# Patient Record
Sex: Male | Born: 1955 | Race: Black or African American | Hispanic: No | Marital: Married | Smoking: Current every day smoker
Health system: Southern US, Community
[De-identification: ages and names within clinical notes are randomized; demographics above are authoritative.]

## PROBLEM LIST (undated history)

## (undated) DIAGNOSIS — Z9289 Personal history of other medical treatment: Secondary | ICD-10-CM

## (undated) DIAGNOSIS — I10 Essential (primary) hypertension: Secondary | ICD-10-CM

## (undated) DIAGNOSIS — H269 Unspecified cataract: Secondary | ICD-10-CM

## (undated) DIAGNOSIS — R0683 Snoring: Secondary | ICD-10-CM

## (undated) DIAGNOSIS — E7800 Pure hypercholesterolemia, unspecified: Secondary | ICD-10-CM

## (undated) DIAGNOSIS — I517 Cardiomegaly: Secondary | ICD-10-CM

## (undated) DIAGNOSIS — R079 Chest pain, unspecified: Secondary | ICD-10-CM

## (undated) HISTORY — DX: Essential (primary) hypertension: I10

## (undated) HISTORY — DX: Snoring: R06.83

## (undated) HISTORY — PX: WISDOM TOOTH EXTRACTION: SHX21

## (undated) HISTORY — DX: Personal history of other medical treatment: Z92.89

## (undated) HISTORY — DX: Chest pain, unspecified: R07.9

## (undated) HISTORY — PX: CATARACT EXTRACTION, BILATERAL: SHX1313

## (undated) HISTORY — DX: Unspecified cataract: H26.9

---

## 2003-03-12 ENCOUNTER — Emergency Department (HOSPITAL_COMMUNITY): Admission: EM | Admit: 2003-03-12 | Discharge: 2003-03-13 | Payer: Self-pay | Admitting: Emergency Medicine

## 2014-06-20 ENCOUNTER — Encounter: Payer: Self-pay | Admitting: Cardiovascular Disease

## 2014-06-20 ENCOUNTER — Ambulatory Visit (INDEPENDENT_AMBULATORY_CARE_PROVIDER_SITE_OTHER): Payer: PRIVATE HEALTH INSURANCE | Admitting: Cardiovascular Disease

## 2014-06-20 VITALS — BP 152/86 | HR 56 | Ht 71.0 in | Wt 188.0 lb

## 2014-06-20 DIAGNOSIS — I10 Essential (primary) hypertension: Secondary | ICD-10-CM

## 2014-06-20 DIAGNOSIS — R9431 Abnormal electrocardiogram [ECG] [EKG]: Secondary | ICD-10-CM | POA: Insufficient documentation

## 2014-06-20 MED ORDER — LOSARTAN POTASSIUM-HCTZ 50-12.5 MG PO TABS: 1.0000 | ORAL_TABLET | Freq: Every day | ORAL | Status: DC

## 2014-06-20 NOTE — Assessment & Plan Note (Signed)
?   Hypertrophic vs LVH vs normal variant for him.  Echo to assess myocardium  Rx BP better  No murmur on exam

## 2014-06-20 NOTE — Patient Instructions (Addendum)
Your physician recommends that you schedule a follow-up appointment in: NEXT AVAILABLE  WITH [facility]  Your physician has recommended you make the following change in your medication:  START  LOSARTAN HCTZ  50/12.5 MG  1 Lamar has requested that you have an echocardiogram. Echocardiography is a painless test that uses sound waves to create images of your heart. It provides your doctor with information about the size and shape of your heart and how well your heart's chambers and valves are working. This procedure takes approximately one hour. There are no restrictions for this procedure.

## 2014-06-20 NOTE — Progress Notes (Signed)
Patient ID: Luke Wheeler, male   DOB: 1956/08/06, 58 y.o.   MRN: [id number]  58 yo referred by primary for HTN and abnormal ECG  Rx for BP with bisoprolol and losartan started in August.  Reviewed ECG and shows SR with LVH He travels a lot and has worked at [employer].  Did not think losartan helped with BP  Atenolol only taken regularly this past week.  No history of chronic kidney Disease.  No excess NSAI or ETOH.  Denies family history of HOCM or sudden death.  Has two children with 2nd wife who he med in [place] as she  Was the [facility] doctor caring for his BP.      ROS: Denies fever, malais, weight loss, blurry vision, decreased visual acuity, cough, sputum, SOB, hemoptysis, pleuritic pain, palpitaitons, heartburn, abdominal pain, melena, lower extremity edema, claudication, or rash.  All other systems reviewed and negative   General: Affect appropriate Healthy:  appears stated age 27: normal Neck supple with no adenopathy JVP normal no bruits no thyromegaly Lungs clear with no wheezing and good diaphragmatic motion Heart:  S1/S2 no murmur,rub, gallop or click PMI normal Abdomen: benighn, BS positve, no tenderness, no AAA no bruit.  No HSM or HJR Distal pulses intact with no bruits No edema Neuro non-focal Skin warm and dry No muscular weakness  Medications Current Outpatient Prescriptions  Medication Sig Dispense Refill  . aspirin EC 81 MG tablet Take 81 mg by mouth daily.      Marland Kitchen atenolol (TENORMIN) 100 MG tablet Take 1/2 tablet daily       No current facility-administered medications for this visit.    Allergies Review of patient's allergies indicates no known allergies.  Family History: Family History  Problem Relation Age of Onset  . Diabetes Mother   . Hyperlipidemia Father   . Stroke Father     Social History: History   Social History  . Marital Status: Divorced    Spouse Name: N/A    Number of Children: N/A  . Years of Education: N/A   Occupational  History  . Not on file.   Social History Main Topics  . Smoking status: Current Every Day Smoker  . Smokeless tobacco: Not on file  . Alcohol Use: Yes  . Drug Use: No  . Sexual Activity: Not on file   Other Topics Concern  . Not on file   Social History Narrative  . No narrative on file    History reviewed. No pertinent past surgical history.  Past Medical History  Diagnosis Date  . Hypertension     Electrocardiogram:  NSR LVH  8/15  Today SR rate 56 LVH with lateral T wave changes   Assessment and Plan

## 2014-06-20 NOTE — Assessment & Plan Note (Signed)
Will start hyzaar 50/12.5 and keep atenolol at current dose.  This is limited by relatively low HR  Will need BMET in 4 weeks and f/u with me as needed

## 2014-06-22 ENCOUNTER — Telehealth (HOSPITAL_COMMUNITY): Payer: Self-pay | Admitting: *Deleted

## 2014-06-22 ENCOUNTER — Ambulatory Visit: Payer: Self-pay | Admitting: Internal Medicine

## 2014-06-26 ENCOUNTER — Ambulatory Visit (HOSPITAL_COMMUNITY): Payer: PRIVATE HEALTH INSURANCE | Attending: Cardiology

## 2014-06-26 DIAGNOSIS — Z720 Tobacco use: Secondary | ICD-10-CM | POA: Insufficient documentation

## 2014-06-26 DIAGNOSIS — R9431 Abnormal electrocardiogram [ECG] [EKG]: Secondary | ICD-10-CM

## 2014-06-26 DIAGNOSIS — I10 Essential (primary) hypertension: Secondary | ICD-10-CM | POA: Diagnosis not present

## 2014-06-26 NOTE — Progress Notes (Signed)
2D Echo completed. 06/26/2014 

## 2014-09-04 ENCOUNTER — Ambulatory Visit: Payer: PRIVATE HEALTH INSURANCE | Admitting: Cardiovascular Disease

## 2014-11-11 NOTE — Progress Notes (Signed)
Patient ID: Luke Wheeler, male   DOB: 03/28/1956, 59 y.o.   MRN: [id number]  58 y.o.  referred by primary for HTN and abnormal ECG  Rx for BP with bisoprolol and losartan started in August.  Reviewed ECG and shows SR with LVH He travels a lot and has worked at [employer].  Did not think losartan helped with BP  Atenolol only taken regularly this past week.  No history of chronic kidney Disease.  No excess NSAI or ETOH.  Denies family history of HOCM or sudden death.  Has two children with 2nd wife who he med in [place] as she  Was the [facility] doctor caring for his BP.    Last visit diuretic added.  Echo reviewed  06/26/14  Impressions:  - Normal LV size and systolic function, EF 65%. There was moderate asymmetric septal hypertrophy best seen in the parasternal long axis. There was no mitral valve SAM or LV outflow tract gradient. Cannot rule out hypertrophic cardiomyopathy variant, but could also be asymmetric hypertrophy in the setting of long-standing hypertension. The RV appeared normal.   ROS: Denies fever, malais, weight loss, blurry vision, decreased visual acuity, cough, sputum, SOB, hemoptysis, pleuritic pain, palpitaitons, heartburn, abdominal pain, melena, lower extremity edema, claudication, or rash.  All other systems reviewed and negative   General: Affect appropriate Healthy:  appears stated age 22: normal Neck supple with no adenopathy JVP normal no bruits no thyromegaly Lungs clear with no wheezing and good diaphragmatic motion Heart:  S1/S2 no murmur,rub, gallop or click PMI normal Abdomen: benighn, BS positve, no tenderness, no AAA no bruit.  No HSM or HJR Distal pulses intact with no bruits No edema Neuro non-focal Skin warm and dry No muscular weakness  Medications Current Outpatient Prescriptions  Medication Sig Dispense Refill  . aspirin EC 81 MG tablet Take 81 mg by mouth daily.    Marland Kitchen atenolol (TENORMIN) 100 MG tablet Take 1/2 tablet daily-PT DOES  NOT TAKE EVERYDAY    . losartan-hydrochlorothiazide (HYZAAR) 50-12.5 MG per tablet Take 1 tablet by mouth daily. 30 tablet 11   No current facility-administered medications for this visit.    Allergies Review of patient's allergies indicates no known allergies.  Family History: Family History  Problem Relation Age of Onset  . Diabetes Mother   . Hyperlipidemia Father   . Stroke Father     Social History: History   Social History  . Marital Status: Divorced    Spouse Name: N/A  . Number of Children: N/A  . Years of Education: N/A   Occupational History  . Not on file.   Social History Main Topics  . Smoking status: Current Every Day Smoker  . Smokeless tobacco: Not on file  . Alcohol Use: Yes  . Drug Use: No  . Sexual Activity: Not on file   Other Topics Concern  . Not on file   Social History Narrative  . No narrative on file    No past surgical history on file.  Past Medical History  Diagnosis Date  . Hypertension     Electrocardiogram:  NSR LVH  8/15  Today SR rate 56 LVH with lateral T wave changes   Assessment and Plan

## 2014-11-12 ENCOUNTER — Encounter: Payer: PRIVATE HEALTH INSURANCE | Admitting: Cardiovascular Disease

## 2014-11-21 ENCOUNTER — Encounter: Payer: Self-pay | Admitting: Cardiovascular Disease

## 2014-11-30 ENCOUNTER — Emergency Department (HOSPITAL_COMMUNITY): Payer: Managed Care, Other (non HMO)

## 2014-11-30 ENCOUNTER — Encounter (HOSPITAL_COMMUNITY): Payer: Self-pay | Admitting: Family Medicine

## 2014-11-30 ENCOUNTER — Emergency Department (HOSPITAL_COMMUNITY): Admission: EM | Admit: 2014-11-30 | Discharge: 2014-11-30 | Discharge status: Absent without leave | Payer: Self-pay

## 2014-11-30 ENCOUNTER — Observation Stay (HOSPITAL_COMMUNITY)
Admission: EM | Admit: 2014-11-30 | Discharge: 2014-12-01 | Disposition: A | Discharge status: Home / Self Care | Payer: Managed Care, Other (non HMO) | Attending: Cardiovascular Disease | Admitting: Cardiovascular Disease

## 2014-11-30 DIAGNOSIS — E780 Pure hypercholesterolemia: Secondary | ICD-10-CM | POA: Insufficient documentation

## 2014-11-30 DIAGNOSIS — Z79899 Other long term (current) drug therapy: Secondary | ICD-10-CM | POA: Diagnosis not present

## 2014-11-30 DIAGNOSIS — I200 Unstable angina: Secondary | ICD-10-CM | POA: Insufficient documentation

## 2014-11-30 DIAGNOSIS — I209 Angina pectoris, unspecified: Secondary | ICD-10-CM | POA: Diagnosis not present

## 2014-11-30 DIAGNOSIS — R079 Chest pain, unspecified: Principal | ICD-10-CM | POA: Insufficient documentation

## 2014-11-30 DIAGNOSIS — I517 Cardiomegaly: Secondary | ICD-10-CM

## 2014-11-30 DIAGNOSIS — I10 Essential (primary) hypertension: Secondary | ICD-10-CM

## 2014-11-30 DIAGNOSIS — Z720 Tobacco use: Secondary | ICD-10-CM | POA: Insufficient documentation

## 2014-11-30 DIAGNOSIS — R002 Palpitations: Secondary | ICD-10-CM | POA: Insufficient documentation

## 2014-11-30 DIAGNOSIS — R9431 Abnormal electrocardiogram [ECG] [EKG]: Secondary | ICD-10-CM | POA: Diagnosis present

## 2014-11-30 DIAGNOSIS — E785 Hyperlipidemia, unspecified: Secondary | ICD-10-CM

## 2014-11-30 HISTORY — DX: Personal history of other medical treatment: Z92.89

## 2014-11-30 HISTORY — DX: Cardiomegaly: I51.7

## 2014-11-30 HISTORY — DX: Pure hypercholesterolemia, unspecified: E78.00

## 2014-11-30 LAB — CBC
HCT: 42.4 % (ref 39.0–52.0)
HEMOGLOBIN: 13.8 g/dL (ref 13.0–17.0)
MCH: 29.2 pg (ref 26.0–34.0)
MCHC: 32.5 g/dL (ref 30.0–36.0)
MCV: 89.6 fL (ref 78.0–100.0)
PLATELETS: 253 10*3/uL (ref 150–400)
RBC: 4.73 MIL/uL (ref 4.22–5.81)
RDW: 12.5 % (ref 11.5–15.5)
WBC: 7.4 10*3/uL (ref 4.0–10.5)

## 2014-11-30 LAB — BASIC METABOLIC PANEL
ANION GAP: 8 (ref 5–15)
BUN: 13 mg/dL (ref 6–23)
CALCIUM: 10 mg/dL (ref 8.4–10.5)
CO2: 25 mmol/L (ref 19–32)
Chloride: 106 mmol/L (ref 96–112)
Creatinine, Ser: 1.26 mg/dL (ref 0.50–1.35)
GFR calc Af Amer: 70 mL/min — ABNORMAL LOW (ref >90)
GFR calc non Af Amer: 61 mL/min — ABNORMAL LOW (ref >90)
Glucose, Bld: 85 mg/dL (ref 70–99)
Potassium: 4.7 mmol/L (ref 3.5–5.1)
Sodium: 139 mmol/L (ref 135–145)

## 2014-11-30 LAB — TSH: TSH: 0.533 u[IU]/mL (ref 0.350–4.500)

## 2014-11-30 LAB — I-STAT TROPONIN, ED
TROPONIN I, POC: 0 ng/mL (ref 0.00–0.08)
Troponin i, poc: 0.01 ng/mL (ref 0.00–0.08)

## 2014-11-30 LAB — BRAIN NATRIURETIC PEPTIDE: B NATRIURETIC PEPTIDE 5: 33.6 pg/mL (ref 0.0–100.0)

## 2014-11-30 LAB — HEPARIN LEVEL (UNFRACTIONATED): HEPARIN UNFRACTIONATED: 0.33 [IU]/mL (ref 0.30–0.70)

## 2014-11-30 LAB — TROPONIN I: Troponin I: <0.03 ng/mL (ref <0.031)

## 2014-11-30 MED ORDER — ACETAMINOPHEN 325 MG PO TABS
650.0000 mg | ORAL_TABLET | ORAL | Status: DC | PRN
  Administered 2014-12-01 (×2): 650 mg via ORAL
  Filled 2014-11-30 (×2): qty 2

## 2014-11-30 MED ORDER — ASPIRIN EC 81 MG PO TBEC
81.0000 mg | DELAYED_RELEASE_TABLET | Freq: Every day | ORAL | Status: DC
  Administered 2014-12-01: 81 mg via ORAL
  Filled 2014-11-30: qty 1

## 2014-11-30 MED ORDER — NITROGLYCERIN IN D5W 200-5 MCG/ML-% IV SOLN
2.0000 ug/min | INTRAVENOUS | Status: DC
  Administered 2014-11-30: 5 ug/min via INTRAVENOUS
  Filled 2014-11-30: qty 250

## 2014-11-30 MED ORDER — ASPIRIN 325 MG PO TABS
325.0000 mg | ORAL_TABLET | Freq: Once | ORAL | Status: AC
  Administered 2014-11-30: 325 mg via ORAL
  Filled 2014-11-30: qty 1

## 2014-11-30 MED ORDER — HYDROCHLOROTHIAZIDE 12.5 MG PO CAPS
12.5000 mg | ORAL_CAPSULE | Freq: Every day | ORAL | Status: DC
  Administered 2014-12-01: 12.5 mg via ORAL
  Filled 2014-11-30 (×2): qty 1

## 2014-11-30 MED ORDER — HEPARIN (PORCINE) IN NACL 100-0.45 UNIT/ML-% IJ SOLN
1050.0000 [IU]/h | INTRAMUSCULAR | Status: DC
  Administered 2014-11-30 – 2014-12-01 (×2): 1050 [IU]/h via INTRAVENOUS
  Filled 2014-11-30 (×2): qty 250

## 2014-11-30 MED ORDER — LOSARTAN POTASSIUM-HCTZ 50-12.5 MG PO TABS: 1.0000 | ORAL_TABLET | Freq: Every day | ORAL | Status: DC

## 2014-11-30 MED ORDER — PRAVASTATIN SODIUM 20 MG PO TABS
10.0000 mg | ORAL_TABLET | Freq: Every day | ORAL | Status: DC
  Administered 2014-11-30: 10 mg via ORAL
  Filled 2014-11-30 (×2): qty 1

## 2014-11-30 MED ORDER — LOSARTAN POTASSIUM 50 MG PO TABS
50.0000 mg | ORAL_TABLET | Freq: Every day | ORAL | Status: DC
  Administered 2014-11-30 – 2014-12-01 (×2): 50 mg via ORAL
  Filled 2014-11-30 (×2): qty 1

## 2014-11-30 MED ORDER — ONDANSETRON HCL 4 MG/2ML IJ SOLN: 4.0000 mg | Freq: Four times a day (QID) | INTRAMUSCULAR | Status: DC | PRN

## 2014-11-30 MED ORDER — HEPARIN BOLUS VIA INFUSION
4000.0000 [IU] | Freq: Once | INTRAVENOUS | Status: AC
  Administered 2014-11-30: 4000 [IU] via INTRAVENOUS
  Filled 2014-11-30: qty 4000

## 2014-11-30 MED ORDER — ATENOLOL 25 MG PO TABS
50.0000 mg | ORAL_TABLET | Freq: Every day | ORAL | Status: DC
  Administered 2014-11-30 – 2014-12-01 (×2): 50 mg via ORAL
  Filled 2014-11-30: qty 1
  Filled 2014-11-30: qty 2

## 2014-11-30 NOTE — ED Provider Notes (Signed)
CSN: [id number]     Arrival date & time 11/30/14  1226 History   First MD Initiated Contact with Patient 11/30/14 1318     Chief Complaint  Patient presents with  . Chest Pain     (Consider location/radiation/quality/duration/timing/severity/associated sxs/prior Treatment) HPI  Pt is a 59yo male with hx of HTN, high cholesterol, current daily smoker, sent to ED by [facility] with reports of an abnormal EKG.  Pt c/o intermittent left sided chest pain, pressure-like in nature with intermittent stabbing sensation, 6/10 at worst. Nothing makes pain better or worse.  Symptoms have been intermittent for 2-3 days but worse since yesterday.  Pt also reports some palpitations and mild SOB. Denies diaphoresis. Denies fever, chills, n/v/d. No cough or congestion. No hx of chest trauma. No hx of PE or DVT. Pt has been seen by cardiology, [name] in the past for abnormal EKG but denies hx of MI.  2D Echo from 06/26/14:  Impressions: - Normal LV size and systolic function, EF 02%. There was moderate asymmetric septal hypertrophy best seen in the parasternal long axis. There was no mitral valve SAM or LV outflow tract gradient. Cannot rule out hypertrophic cardiomyopathy variant, but could also be asymmetric hypertrophy in the setting of long-standing hypertension. The RV appeared normal.  Past Medical History  Diagnosis Date  . Hypertension   . High cholesterol    History reviewed. No pertinent past surgical history. Family History  Problem Relation Age of Onset  . Diabetes Mother   . Hyperlipidemia Father   . Stroke Father    History  Substance Use Topics  . Smoking status: Current Every Day Smoker  . Smokeless tobacco: Not on file  . Alcohol Use: Yes    Review of Systems  Constitutional: Negative for fever, chills, diaphoresis, appetite change and fatigue.  Respiratory: Negative for cough and shortness of breath.   Cardiovascular: Positive for chest pain and palpitations.  Negative for leg swelling.  Gastrointestinal: Negative for nausea, vomiting, abdominal pain and diarrhea.  Musculoskeletal: Negative for myalgias and back pain.  All other systems reviewed and are negative.     Allergies  Review of patient's allergies indicates no known allergies.  Home Medications   Prior to Admission medications   Medication Sig Start Date End Date Taking? Authorizing Provider  atenolol (TENORMIN) 100 MG tablet Take 50 mg by mouth daily. PT DOES NOT TAKE EVERYDAY 06/12/14  Yes Historical Provider, MD  fluvastatin (LESCOL) 20 MG capsule Take 20 mg by mouth at bedtime.   Yes Historical Provider, MD  losartan-hydrochlorothiazide (HYZAAR) 50-12.5 MG per tablet Take 1 tablet by mouth daily. 06/20/14  Yes [name], MD  VIAGRA 50 MG tablet Take 50 mg by mouth as needed. 11/26/14  Yes Historical Provider, MD   BP 167/84 mmHg  Pulse 65  Temp(Src) 98.1 F (36.7 C) (Oral)  Resp 22  SpO2 100% Physical Exam  Constitutional: He appears well-developed and well-nourished. No distress.  HENT:  Head: Normocephalic and atraumatic.  Eyes: Conjunctivae are normal. No scleral icterus.  Neck: Normal range of motion.  Cardiovascular: Normal rate, regular rhythm and normal heart sounds.   Pulmonary/Chest: Effort normal and breath sounds normal. No respiratory distress. He has no wheezes. He has no rales. He exhibits no tenderness.  Abdominal: Soft. Bowel sounds are normal. He exhibits no distension and no mass. There is no tenderness. There is no rebound and no guarding.  Musculoskeletal: Normal range of motion.  Neurological: He is alert.  Skin:  Skin is warm and dry. He is not diaphoretic.  Nursing note and vitals reviewed.   ED Course  Procedures (including critical care time) Labs Review Labs Reviewed  BASIC METABOLIC PANEL - Abnormal; Notable for the following:    GFR calc non Af Amer 61 (*)    GFR calc Af Amer 70 (*)    All other components within normal limits   CBC  BRAIN NATRIURETIC PEPTIDE  I-STAT TROPOININ, ED  Randolm Idol, ED    Imaging Review Dg Chest 2 View  11/30/2014   CLINICAL DATA:  Left-sided chest pain  EXAM: CHEST  2 VIEW  COMPARISON:  None.  FINDINGS: Cardiac shadow is at the upper limits of normal in size. The lungs are clear bilaterally. No focal infiltrate or sizable effusion is noted. No bony abnormality is seen.  IMPRESSION: No active cardiopulmonary disease.   Electronically Signed   By: Inez Catalina M.D.   On: 11/30/2014 13:47     EKG Interpretation   Date/Time:  Friday November 30 2014 12:33:39 EDT Ventricular Rate:  65 PR Interval:  176 QRS Duration: 82 QT Interval:  374 QTC Calculation: 388 R Axis:   58 Text Interpretation:  Normal sinus rhythm T wave abnormality, consider  lateral ischemia Abnormal ECG Confirmed by Jeneen Rinks  MD, [place] ([id number]) on  11/30/2014 3:23:08 PM      MDM   Final diagnoses:  Chest pain    Pt presenting to ED with c/o intermittent left sided chest pain, mild SOB, and palpitations for 2-3 days.  Sent to ED for further evaluation of abnormal EKG. EKG interpreted by Dr. Tanna Furry, Normal sinus rhythm T wave abnormality, consider lateral ischemia.    Initial troponin: negative for elevation. Discussed pt with Dr. Jeneen Rinks, pt is moderate risk for Major cardiac event based off HEART score, will consult cardiology.  Cardiology to come evaluate pt to help determine pt's disposition.  4:12 PM Consulted with cardiology, pt will be admitted to cardiology service for stress-test in the morning. Pt is stable at this time.  Filed Vitals:   11/30/14 1424  BP: 167/84  Pulse: 65 1400  Temp: 98.52F 1308  Resp: 22  O2 Sat 100% on RA @ [address] [name] 11/30/14 [place], MD 12/01/14 [id number]

## 2014-11-30 NOTE — H&P (Signed)
Cardiologist:  Luke Wheeler is an 59 y.o. male.   Chief Complaint: Chest Pain HPI:   The patient is a 59 yo male with a history of HLD, HTN, tobacco abuse.  EKG shows inferior and lateral TWI which were there in October 2015.  He had an echo in November 2015 revealing an EF of 55%, moderate asymmetric hypertrophy.  He presents with chest pain which started originally 3-4 weeks ago.  He noticed it while playing tennis one day, however, the next time he played he did not feel it.  Yesterday he woke up with chest pain which he describes as "compression". Today it was worse, 7/10.   He's had some dizziness and numbness in his fingers but it does not coincide with the CP.   He travels a lot for the [employer]. Recently came back from [place] and next week will be heading to [place] for about two months.  The patient currently denies nausea, vomiting, fever, shortness of breath, orthopnea, PND, cough, congestion, abdominal pain, hematochezia, melena, lower extremity edema, claudication.  Current pain level 5/10.    Past Medical History  Diagnosis Date  . Hypertension   . High cholesterol     History reviewed. No pertinent past surgical history.  Family History  Problem Relation Age of Onset  . Diabetes Mother   . Hyperlipidemia Father   . Stroke Father   . Coronary artery disease Father    Social History:  reports that he has been smoking.  He does not have any smokeless tobacco history on file. He reports that he drinks alcohol. He reports that he does not use illicit drugs.  Allergies: No Known Allergies   (Not in a hospital admission)  Results for orders placed or performed during the hospital encounter of 11/30/14 (from the past 48 hour(s))  CBC     Status: None   Collection Time: 11/30/14 12:45 PM  Result Value Ref Range   WBC 7.4 4.0 - 10.5 K/uL   RBC 4.73 4.22 - 5.81 MIL/uL   Hemoglobin 13.8 13.0 - 17.0 g/dL   HCT 42.4 39.0 - 52.0 %   MCV 89.6 78.0 - 100.0 fL   MCH 29.2 26.0  - 34.0 pg   MCHC 32.5 30.0 - 36.0 g/dL   RDW 12.5 11.5 - 15.5 %   Platelets 253 150 - 400 K/uL  Basic metabolic panel     Status: Abnormal   Collection Time: 11/30/14 12:45 PM  Result Value Ref Range   Sodium 139 135 - 145 mmol/L   Potassium 4.7 3.5 - 5.1 mmol/L   Chloride 106 96 - 112 mmol/L   CO2 25 19 - 32 mmol/L   Glucose, Bld 85 70 - 99 mg/dL   BUN 13 6 - 23 mg/dL   Creatinine, Ser 1.26 0.50 - 1.35 mg/dL   Calcium 10.0 8.4 - 10.5 mg/dL   GFR calc non Af Amer 61 (L) >90 mL/min   GFR calc Af Amer 70 (L) >90 mL/min    Comment: (NOTE) The eGFR has been calculated using the CKD EPI equation. This calculation has not been validated in all clinical situations. eGFR's persistently <90 mL/min signify possible Chronic Kidney Disease.    Anion gap 8 5 - 15  BNP (order ONLY if patient complains of dyspnea/SOB AND you have documented it for THIS visit)     Status: None   Collection Time: 11/30/14 12:45 PM  Result Value Ref Range   B Natriuretic Peptide 33.6  0.0 - 100.0 pg/mL  I-stat troponin, ED (not at [facility])     Status: None   Collection Time: 11/30/14  1:02 PM  Result Value Ref Range   Troponin i, poc 0.00 0.00 - 0.08 ng/mL   Comment 3            Comment: Due to the release kinetics of cTnI, a negative result within the first hours of the onset of symptoms does not rule out myocardial infarction with certainty. If myocardial infarction is still suspected, repeat the test at appropriate intervals.    Dg Chest 2 View  11/30/2014   CLINICAL DATA:  Left-sided chest pain  EXAM: CHEST  2 VIEW  COMPARISON:  None.  FINDINGS: Cardiac shadow is at the upper limits of normal in size. The lungs are clear bilaterally. No focal infiltrate or sizable effusion is noted. No bony abnormality is seen.  IMPRESSION: No active cardiopulmonary disease.   Electronically Signed   By: Inez Catalina M.D.   On: 11/30/2014 13:47    Review of Systems  Constitutional: Negative for fever and diaphoresis.   HENT: Positive for congestion (Nasal). Negative for sore throat.   Respiratory: Negative for cough and shortness of breath.   Cardiovascular: Positive for chest pain. Negative for orthopnea, leg swelling and PND.  Gastrointestinal: Negative for nausea, vomiting, abdominal pain, blood in stool and melena.  Genitourinary: Negative for hematuria.  Musculoskeletal: Negative for myalgias.  Neurological: Positive for dizziness and headaches (left side).  All other systems reviewed and are negative.   Blood pressure 153/89, pulse 64, temperature 98.1 F (36.7 C), temperature source Oral, resp. rate 22, SpO2 99 %. Physical Exam  Nursing note and vitals reviewed. Constitutional: He is oriented to person, place, and time. He appears well-developed and well-nourished. No distress.  HENT:  Head: Normocephalic and atraumatic.  Mouth/Throat: No oropharyngeal exudate.  Eyes: EOM are normal. Pupils are equal, round, and reactive to light. No scleral icterus.  Neck: Normal range of motion. Neck supple. No JVD present.  Cardiovascular: Normal rate, regular rhythm, S1 normal and S2 normal.   No murmur heard. Pulses:      Radial pulses are 2+ on the right side, and 2+ on the left side.       Dorsalis pedis pulses are 2+ on the right side, and 2+ on the left side.  No carotid bruits  Respiratory: Effort normal and breath sounds normal. He has no wheezes. He has no rales.  GI: Soft. Bowel sounds are normal. He exhibits no distension. There is no tenderness.  Musculoskeletal: He exhibits no edema.  Lymphadenopathy:    He has no cervical adenopathy.  Neurological: He is alert and oriented to person, place, and time. He exhibits normal muscle tone.  Skin: Skin is warm and dry.  Psychiatric: He has a normal mood and affect.     Assessment/Plan Principal Problem:   Chest pain Active Problems:   HTN (hypertension)   Tobacco abuse  Plan:  Initial troponin negative.  Inferior and lateral TWIs were  seen on prior EKG in October.  Echo, November 2015: 55%, moderate asymmetric hypertrophy.  He currently is having CP, 5/10.   Will add Heparin and IV NTG.  If pain free and troponin negative in the morning, Treadmill cardiolite tomorrow.  Check lipids, A1C, TSH.   Add beta blocker.  He does not look like he has a PE despite traveling a lot.  Tarri Fuller, [facility] 11/30/2014, 4:19 PM   The patient was  seen, examined and discussed with Tarri Fuller, [name] and I agree with the above.   59 year old male who presented with chest pain, that is typical exertional radiating to his arm. His risk factors include HTN, hyperlipidemia and ongoing smoking. He has abnormal ECG with negative T waves in the inferolateral leads. This is unchanged from prior ECG on 06/20/2014. Echocardiogram showed LVEF 55% with no regional wall motion abnormalities in November 2015. The first 2 troponins are negative.  We will start heparin and NTG drip.  Since its Friday, we will perform exercise nuclear stress test tomorrow, if any abnormality, we will plan for a left cardiac cath on Monday.  Add aspirin to his med list, continue atenolol, losartan, fluvastatin.   Dorothy Spark 11/30/2014

## 2014-11-30 NOTE — Progress Notes (Signed)
ANTICOAGULATION CONSULT NOTE - Initial Consult  Pharmacy Consult for Heparin Indication: chest pain/ACS  No Known Allergies  Patient Measurements:   Heparin Dosing Weight: 86 kg  Vital Signs: Temp: 98.1 F (36.7 C) (04/08 1308) Temp Source: Oral (04/08 1308) BP: 152/92 mmHg (04/08 1617) Pulse Rate: 64 (04/08 1600)  Labs:  Recent Labs  11/30/14 1245  HGB 13.8  HCT 42.4  PLT 253  CREATININE 1.26    CrCl cannot be calculated (Unknown ideal weight.).   Medical History: Past Medical History  Diagnosis Date  . Hypertension   . High cholesterol     Assessment: 6 YOM presented with chest pain, pharmacy is consulted to start IV heparin. He is not on anticoagulation PTA. Hgb 13.8, plt 253k.   Goal of Therapy:  Heparin level 0.3-0.7 units/ml Monitor platelets by anticoagulation protocol: Yes   Plan:  - Heparin bolus 4000 units x 1 then Heparin infusion 1050 units/hr - f/u 6 hr heparin level at 2300 - Daily heparin level and CBC  Maryanna Shape, PharmD, BCPS  Clinical Pharmacist  Pager: [phone]   11/30/2014,4:36 PM

## 2014-11-30 NOTE — Progress Notes (Signed)
ANTICOAGULATION CONSULT NOTE - Follow Up Consult  Pharmacy Consult for heparin Indication: chest pain/ACS  Labs:  Recent Labs  11/30/14 1245 11/30/14 2041 11/30/14 2234  HGB 13.8  --   --   HCT 42.4  --   --   PLT 253  --   --   HEPARINUNFRC  --   --  0.33  CREATININE 1.26  --   --   TROPONINI  --  <0.03  --      Assessment/Plan:  59yo male therapeutic on heparin with initial dosing for CP. Will continue gtt at current rate and confirm stable with am labs.   Wynona Neat, PharmD, BCPS  11/30/2014,11:57 PM

## 2014-11-30 NOTE — ED Notes (Signed)
Pt sent here from [facility] with abnormal EKG. sts chest pain over the past few days and worsening since yesterday. sts some heart palpitations and SOB

## 2014-12-01 ENCOUNTER — Other Ambulatory Visit: Payer: Self-pay | Admitting: Physician Assistant

## 2014-12-01 ENCOUNTER — Encounter (HOSPITAL_COMMUNITY): Payer: Self-pay | Admitting: Physician Assistant

## 2014-12-01 DIAGNOSIS — R0789 Other chest pain: Secondary | ICD-10-CM | POA: Diagnosis not present

## 2014-12-01 DIAGNOSIS — I517 Cardiomegaly: Secondary | ICD-10-CM

## 2014-12-01 LAB — CBC
HCT: 39.2 % (ref 39.0–52.0)
Hemoglobin: 12.4 g/dL — ABNORMAL LOW (ref 13.0–17.0)
MCH: 28.8 pg (ref 26.0–34.0)
MCHC: 31.6 g/dL (ref 30.0–36.0)
MCV: 91.2 fL (ref 78.0–100.0)
Platelets: 230 10*3/uL (ref 150–400)
RBC: 4.3 MIL/uL (ref 4.22–5.81)
RDW: 12.7 % (ref 11.5–15.5)
WBC: 8.1 10*3/uL (ref 4.0–10.5)

## 2014-12-01 LAB — HEPARIN LEVEL (UNFRACTIONATED): Heparin Unfractionated: 0.35 IU/mL (ref 0.30–0.70)

## 2014-12-01 LAB — TROPONIN I

## 2014-12-01 MED ORDER — ASPIRIN 81 MG PO TBEC: 81.0000 mg | DELAYED_RELEASE_TABLET | Freq: Every day | ORAL | Status: AC

## 2014-12-01 NOTE — Discharge Instructions (Signed)
Do not take Atenolol Monday morning until you hear about your stress test.  We want you to hold this medication the day of your stress test.  If they schedule it for Tuesday, you will take it and hold it Tuesday morning.  DO NOT EAT after midnight tomorrow (Sunday) night.  No coffee Monday morning.  You can take your medications with water.  Our office will call to arrange the stress test and give your further instructions.  Call [phone] for questions.

## 2014-12-01 NOTE — Progress Notes (Signed)
ANTICOAGULATION CONSULT NOTE - Follow Up Consult  Pharmacy Consult for heparin Indication: chest pain/ACS  No Known Allergies  Patient Measurements: Height: 5\' 11"  (180.3 cm) Weight: 191 lb 5.3 oz (86.788 kg) IBW/kg (Calculated) : 75.3  Vital Signs: Temp: 98.2 F (36.8 C) (04/09 0617) Temp Source: Oral (04/09 0617) BP: 128/76 mmHg (04/09 0617) Pulse Rate: 87 (04/09 0617)  Labs:  Recent Labs  11/30/14 1245 11/30/14 2041 11/30/14 2234 11/30/14 2330 12/01/14 0534  HGB 13.8  --   --   --  Luke.4*  HCT 42.4  --   --   --  39.2  PLT 253  --   --   --  230  HEPARINUNFRC  --   --  0.33  --  0.35  CREATININE 1.26  --   --   --   --   TROPONINI  --  <0.03  --  <0.03 <0.03    Estimated Creatinine Clearance: 67.2 mL/min (by C-G formula based on Cr of 1.26).   Medications:  Scheduled:  . aspirin EC  81 mg Oral Daily  . atenolol  50 mg Oral Daily  . losartan  50 mg Oral Daily   And  . hydrochlorothiazide  Luke.5 mg Oral Daily  . pravastatin  10 mg Oral q1800    Assessment: 59 yo Wheeler here with CP on heparin for r/o ACS. Heparin level is 0.35 and noted at goal. Hg/hct= Luke.4/39.2 and plt= 230. Plans noted for exercise nuclear stress test today and possible cath Monday based on results.   Goal of Therapy:  Heparin level 0.3-0.7 units/ml Monitor platelets by anticoagulation protocol: Yes   Plan:  -No heparin changes needed -Daily heparin level and CBC  Hildred Laser, Pharm D 12/01/2014 10:36 AM

## 2014-12-01 NOTE — Discharge Summary (Addendum)
Discharge Summary   Patient ID: Luke Wheeler, MRN: [id number], DOB/AGE: July 26, 1956 59 y.o.  Admit date: 11/30/2014 Discharge date: 12/01/2014   Primary Care Physician:  No primary care provider on file.   Primary Cardiologist:  Dr. [name]   Reason for Admission:  Chest Pain   Primary Discharge Diagnoses:  Principal Problem:   Chest pain Active Problems:   HTN (hypertension)   Abnormal ECG   Tobacco abuse   LVH (left ventricular hypertrophy)     Wt Readings from Last 3 Encounters:  12/01/14 191 lb 5.3 oz (86.788 kg)  06/20/14 188 lb (85.276 kg)    Secondary Discharge Diagnoses:   Past Medical History  Diagnosis Date  . Hypertension   . High cholesterol   . Hx of echocardiogram     Echo 11/15:  mod asymmetric septal hypertrophy, no LVOT gradient, EF 55%, no RWMA, Gr 1 DD, septal 16 mm, post wall 11.2 mm, no SAM, trivial MR, normal RVF, mild RAE (HCM variant vs LVH)  . 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 male HL, HTN, tobacco abuse.  Echo in 06/2014 demonstrated mod asymmetric septal hypertrophy without LVOT gradient, mild diastolic dysfunction.  Hypertrophic CM variant could not be rule out vs LVH from HTN.  He presented to the hospital on the date of admission with complaints of chest pain. He started having CP 3-4 weeks prior to admission.  He had it occasionally with exertion, but not always with exertion.  He awoke with chest pain the day prior to admission.  He had recently traveled to [place] for [employer].  He was noted to inferior and lateral TWI on the ECG that was old.  He was placed on NTG gtt and IV Heparin.  CEs remained negative. Plan was to get inpatient ETT-Myoview.  The test was never ordered.  The patient was seen by Dr. [name] this AM.  He was pain free. ECG changes  were felt to likely represent non-obstructive HCM.  It was felt that he could be DC to home with early OP stress Myoview.  Note was made to consider changing medications in the future to add Verapamil and DC diuretic. For now, current medications were continued. He was felt to be stable for DC to home.     Discharge Vitals:   Blood pressure 124/80, pulse 87, temperature 98.2 F (36.8 C), temperature source Oral, resp. rate 20, height 5\' 11"  (1.803 m), weight 191 lb 5.3 oz (86.788 kg), SpO2 98 %.   Labs:   Recent Labs  11/30/14 1245 12/01/14 0534  WBC 7.4 8.1  HGB 13.8 12.4*  HCT 42.4 39.2  MCV 89.6 91.2  PLT 253 230     Recent Labs  11/30/14 1245  NA 139  K 4.7  CL 106  CO2 25  BUN 13  CREATININE 1.26  CALCIUM 10.0     Recent Labs  11/30/14 2041 11/30/14 2330 12/01/14 0534  TROPONINI <0.03 <0.03 <0.03    No results found for: CHOL, HDL, LDLCALC, TRIG  No results found for: DDIMER  Lab Results  Component Value Date   TSH 0.533 11/30/2014    No results for input(s): INR in the last 72 hours.   Diagnostic Procedures and Studies:  Dg Chest  2 View  11/30/2014    IMPRESSION: No active cardiopulmonary disease.   Electronically Signed   By: Inez Catalina M.D.   On: 11/30/2014 13:47    Disposition:   Pt is being discharged home today in good condition.  Follow-up Plans & Appointments      Follow-up Information    Follow up with [facility].   Why:  We will arrange a stress test Monday or Tuesday   Contact information:   [address] [place] [address]       Follow up with [name], MD In 1 week.   Specialty:  Cardiology   Why:  office will arrange follow up with [name] or PA/NP 1-2 weeks after discharge from hospital   Contact information:   [address] [id number] [phone]       Discharge Medications    Medication List    TAKE these medications         aspirin 81 MG EC tablet  Take 1 tablet (81 mg total) by mouth daily.     atenolol 100 MG tablet  Commonly known as:  TENORMIN  Take 50 mg by mouth daily. PT DOES NOT TAKE EVERYDAY     fluvastatin 20 MG capsule  Commonly known as:  LESCOL  Take 20 mg by mouth at bedtime.     losartan-hydrochlorothiazide 50-12.5 MG per tablet  Commonly known as:  HYZAAR  Take 1 tablet by mouth daily.     VIAGRA 50 MG tablet  Generic drug:  sildenafil  Take 50 mg by mouth as needed.         Outstanding Labs/Studies  1. ETT-Myoview  Duration of Discharge Encounter: Greater than 30 minutes including physician and PA time.  Signed, Richardson Dopp, [name]   12/01/2014 1:41 PM

## 2014-12-01 NOTE — Progress Notes (Signed)
UR completed 

## 2014-12-01 NOTE — Progress Notes (Signed)
Patient ID: Luke Wheeler, male   DOB: June 17, 1956, 59 y.o.   MRN: [id number]    Subjective:  Denies SSCP, palpitations or Dyspnea No stress test ordered by Dr Meda Coffee on admission   Objective:  Filed Vitals:   12/01/14 0317 12/01/14 0417 12/01/14 0617 12/01/14 1045  BP: 118/70 120/75 128/76 124/80  Pulse:   87   Temp:   98.2 F (36.8 C)   TempSrc:   Oral   Resp:   20   Height:      Weight:   191 lb 5.3 oz (86.788 kg)   SpO2:   98%     Intake/Output from previous day:  Intake/Output Summary (Last 24 hours) at 12/01/14 1156 Last data filed at 12/01/14 0806  Gross per 24 hour  Intake 1136.68 ml  Output    300 ml  Net 836.68 ml    Physical Exam: Affect appropriate Healthy:  appears stated age HEENT: normal Neck supple with no adenopathy JVP normal no bruits no thyromegaly Lungs clear with no wheezing and good diaphragmatic motion Heart:  S1/S2 no murmur, no rub, gallop or click PMI normal Abdomen: benighn, BS positve, no tenderness, no AAA no bruit.  No HSM or HJR Distal pulses intact with no bruits No edema Neuro non-focal Skin warm and dry No muscular weakness   Lab Results: Basic Metabolic Panel:  Recent Labs  11/30/14 1245  NA 139  K 4.7  CL 106  CO2 25  GLUCOSE 85  BUN 13  CREATININE 1.26  CALCIUM 10.0   Liver Function Tests: No results for input(s): AST, ALT, ALKPHOS, BILITOT, PROT, ALBUMIN in the last 72 hours. No results for input(s): LIPASE, AMYLASE in the last 72 hours. CBC:  Recent Labs  11/30/14 1245 12/01/14 0534  WBC 7.4 8.1  HGB 13.8 12.4*  HCT 42.4 39.2  MCV 89.6 91.2  PLT 253 230   Cardiac Enzymes:  Recent Labs  11/30/14 2041 11/30/14 2330 12/01/14 0534  TROPONINI <0.03 <0.03 <0.03     Recent Labs  11/30/14 2041  TSH 0.533    Imaging: Dg Chest 2 View  11/30/2014   CLINICAL DATA:  Left-sided chest pain  EXAM: CHEST  2 VIEW  COMPARISON:  None.  FINDINGS: Cardiac shadow is at the upper limits of normal in size.  The lungs are clear bilaterally. No focal infiltrate or sizable effusion is noted. No bony abnormality is seen.  IMPRESSION: No active cardiopulmonary disease.   Electronically Signed   By: Inez Catalina M.D.   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 aspirin EC  81 mg Oral Daily  . atenolol  50 mg Oral Daily  . losartan  50 mg Oral Daily   And  . hydrochlorothiazide  12.5 mg Oral Daily  . pravastatin  10 mg Oral q1800     . heparin 1,050 Units/hr (12/01/14 0955)  . 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

## 2014-12-03 ENCOUNTER — Telehealth: Payer: Self-pay | Admitting: Cardiovascular Disease

## 2014-12-03 LAB — HEMOGLOBIN A1C
Hgb A1c MFr Bld: 5.5 % (ref 4.8–5.6)
Mean Plasma Glucose: 111 mg/dL

## 2014-12-03 NOTE — Telephone Encounter (Signed)
New message         TOC appt is Thursday 4/21 @ 12:10pm with [name]

## 2014-12-04 ENCOUNTER — Telehealth (HOSPITAL_COMMUNITY): Payer: Self-pay

## 2014-12-04 ENCOUNTER — Ambulatory Visit (HOSPITAL_COMMUNITY)
Admission: RE | Admit: 2014-12-04 | Discharge: 2014-12-04 | Disposition: A | Discharge status: Home / Self Care | Payer: Managed Care, Other (non HMO) | Source: Ambulatory Visit | Attending: Cardiovascular Disease | Admitting: Cardiovascular Disease

## 2014-12-04 DIAGNOSIS — R9431 Abnormal electrocardiogram [ECG] [EKG]: Secondary | ICD-10-CM | POA: Insufficient documentation

## 2014-12-04 DIAGNOSIS — I10 Essential (primary) hypertension: Secondary | ICD-10-CM | POA: Diagnosis not present

## 2014-12-04 DIAGNOSIS — F17210 Nicotine dependence, cigarettes, uncomplicated: Secondary | ICD-10-CM | POA: Diagnosis not present

## 2014-12-04 DIAGNOSIS — R0789 Other chest pain: Secondary | ICD-10-CM | POA: Insufficient documentation

## 2014-12-04 DIAGNOSIS — R42 Dizziness and giddiness: Secondary | ICD-10-CM | POA: Insufficient documentation

## 2014-12-04 DIAGNOSIS — R5383 Other fatigue: Secondary | ICD-10-CM | POA: Diagnosis not present

## 2014-12-04 DIAGNOSIS — R002 Palpitations: Secondary | ICD-10-CM | POA: Insufficient documentation

## 2014-12-04 DIAGNOSIS — R0609 Other forms of dyspnea: Secondary | ICD-10-CM | POA: Diagnosis not present

## 2014-12-04 MED ORDER — TECHNETIUM TC 99M SESTAMIBI GENERIC - CARDIOLITE
30.8000 | Freq: Once | INTRAVENOUS | Status: AC | PRN
  Administered 2014-12-04: 30.8 via INTRAVENOUS

## 2014-12-04 MED ORDER — TECHNETIUM TC 99M SESTAMIBI GENERIC - CARDIOLITE
10.4000 | Freq: Once | INTRAVENOUS | Status: AC | PRN
  Administered 2014-12-04: 10 via INTRAVENOUS

## 2014-12-04 NOTE — Procedures (Addendum)
[facility] [address] [place] [address] [phone]  Cardiology Nuclear Med Study  Luke Wheeler is a 59 y.o. male     MRN : [id number]     DOB: Feb 19, 1956  Procedure Date: 12/04/2014  Nuclear Med Background Indication for Stress Test:  Evaluation for Ischemia, [facility] and Abnormal EKG History:  nonobstructive ECHO-2015;No prior cardiac or respiratory history reported;No prior NUC MPI fop comparison. Cardiac Risk Factors: Hypertension, Lipids and Smoker  Symptoms:  Chest Pain, Dizziness, DOE, Fatigue, Light-Headedness and Palpitations   Nuclear Pre-Procedure Caffeine/Decaff Intake:  12:30am NPO After: 8:30am   IV Site: R Forearm  IV 0.9% NS with Angio Cath:  22g  Chest Size (in):  44"  IV Started by: Rolene Course, RN  Height: 5\' 11"  (1.803 m)  Cup Size: n/a  BMI:  Body mass index is 26.65 kg/(m^2). Weight:  191 lb (86.637 kg)   Tech Comments:  n/a    Nuclear Med Study 1 or 2 day study: 1 day  Stress Test Type:  Stress  Order Authorizing Provider:  [name], MD   Resting Radionuclide: Technetium 36m Sestamibi  Resting Radionuclide Dose: 10.4 mCi   Stress Radionuclide:  Technetium 20m Sestamibi  Stress Radionuclide Dose: 30.8 mCi           Stress Protocol Rest HR: 53 Stress HR: 160  Rest BP: 166/99 Stress BP: 198/96  Exercise Time (min): 8 METS: 10.10   Predicted Max HR: 161 bpm % Max HR: 99.38 bpm Rate Pressure Product: 31680  Dose of Adenosine (mg):  n/a Dose of Lexiscan: n/a mg  Dose of Atropine (mg): n/a Dose of Dobutamine: n/a mcg/kg/min (at max HR)  Stress Test Technologist: Mellody Memos, CCT Nuclear Technologist:Elizabeth Young,CNMT   Rest Procedure:  Myocardial perfusion imaging was performed at rest 45 minutes following the intravenous administration of Technetium 29m Sestamibi. Stress Procedure:  The patient performed treadmill exercise using a Bruce  Protocol for 8 minutes. The  patient stopped due to shortness of breath. Patient denied any chest pain.  There were significant ST-T wave changes.  Technetium 23m Sestamibi was injected IV at peak exercise and myocardial perfusion imaging was performed after a brief delay.  Transient Ischemic Dilatation (Normal <1.22):  1.11 QGS EDV:  115 ml QGS ESV:  58 ml LV Ejection Fraction: 50%     Rest ECG: NSR with T wave inversion 2 ,3 aVF, V5-6.  Stress ECG: No significant ST segment change suggestive of ischemia. Normalization of baseline T wave abnormality with exercise with return to baseline during recovery  QPS Raw Data Images:  Normal; no motion artifact; normal heart/lung ratio. Stress Images:  Normal homogeneous uptake in all areas of the myocardium. Rest Images:  Normal homogeneous uptake in all areas of the myocardium. Subtraction (SDS):  Normal  Impression Exercise Capacity:  Good exercise capacity. BP Response:  Normal blood pressure response. Clinical Symptoms:  No significant symptoms noted. ECG Impression:  No significant ST segment change suggestive of ischemia. Comparison with Prior Nuclear Study: No previous nuclear study performed  Overall Impression:  Normal stress nuclear study.  LV Wall Motion: Low normal LV Function, EF 50%; NL Wall Motion   Genasis Zingale A, MD  12/04/2014 4:53 PM

## 2014-12-04 NOTE — Telephone Encounter (Signed)
Encounter complete. 

## 2014-12-04 NOTE — Telephone Encounter (Signed)
Patient contacted regarding discharge from [facility] on 12/01/14.  Patient understands to follow up with provider Richardson Dopp on April 21,2016  at 12:10PM at [facility]. Patient understands discharge instructions? yes Patient understands medications and regiment? yes Patient understands to bring all medications to this visit? yes  Pt aware he has myoview appt today at 1PM.

## 2014-12-05 ENCOUNTER — Telehealth: Payer: Self-pay | Admitting: *Deleted

## 2014-12-05 ENCOUNTER — Encounter: Payer: Self-pay | Admitting: Physician Assistant

## 2014-12-05 NOTE — Telephone Encounter (Signed)
pt notified about normal myoview results per [name]. Pt said ok and thank you.

## 2014-12-05 NOTE — Telephone Encounter (Signed)
lmptcb to go over Juliette results

## 2014-12-07 ENCOUNTER — Encounter: Payer: Self-pay | Admitting: Physician Assistant

## 2014-12-07 ENCOUNTER — Ambulatory Visit (INDEPENDENT_AMBULATORY_CARE_PROVIDER_SITE_OTHER): Payer: Managed Care, Other (non HMO) | Admitting: Physician Assistant

## 2014-12-07 VITALS — BP 119/78 | HR 79 | Ht 71.0 in | Wt 188.0 lb

## 2014-12-07 DIAGNOSIS — I517 Cardiomegaly: Secondary | ICD-10-CM | POA: Diagnosis not present

## 2014-12-07 DIAGNOSIS — R0789 Other chest pain: Secondary | ICD-10-CM

## 2014-12-07 DIAGNOSIS — I10 Essential (primary) hypertension: Secondary | ICD-10-CM | POA: Diagnosis not present

## 2014-12-07 MED ORDER — VERAPAMIL HCL ER 120 MG PO TBCR: 120.0000 mg | EXTENDED_RELEASE_TABLET | Freq: Every day | ORAL | Status: DC

## 2014-12-07 NOTE — Patient Instructions (Addendum)
Medication Instructions:  1. STOP ATENOLOL 2. START VERAPAMIL SR 120 MG 1 TABLET DAILY AT BEDTIME  Labwork: NONE  Testing/Procedures: NONE  Follow-Up: 2 MONTHS WITH [name]  Any Other Special Instructions Will Be Listed Below (If Applicable).   Smoking Cessation Quitting smoking is important to your health and has many advantages. However, it is not always easy to quit since nicotine is a very addictive drug. Oftentimes, people try 3 times or more before being able to quit. This document explains the best ways for you to prepare to quit smoking. Quitting takes hard work and a lot of effort, but you can do it. ADVANTAGES OF QUITTING SMOKING  You will live longer, feel better, and live better.  Your body will feel the impact of quitting smoking almost immediately.  Within 20 minutes, blood pressure decreases. Your pulse returns to its normal level.  After 8 hours, carbon monoxide levels in the blood return to normal. Your oxygen level increases.  After 24 hours, the chance of having a heart attack starts to decrease. Your breath, hair, and body stop smelling like smoke.  After 48 hours, damaged nerve endings begin to recover. Your sense of taste and smell improve.  After 72 hours, the body is virtually free of nicotine. Your bronchial tubes relax and breathing becomes easier.  After 2 to 12 weeks, lungs can hold more air. Exercise becomes easier and circulation improves.  The risk of having a heart attack, stroke, cancer, or lung disease is greatly reduced.  After 1 year, the risk of coronary heart disease is cut in half.  After 5 years, the risk of stroke falls to the same as a nonsmoker.  After 10 years, the risk of lung cancer is cut in half and the risk of other cancers decreases significantly.  After 15 years, the risk of coronary heart disease drops, usually to the level of a nonsmoker.  If you are pregnant, quitting smoking will improve your chances of having a  healthy baby.  The people you live with, especially any children, will be healthier.  You will have extra money to spend on things other than cigarettes. QUESTIONS TO THINK ABOUT BEFORE ATTEMPTING TO QUIT You may want to talk about your answers with your health care provider.  Why do you want to quit?  If you tried to quit in the past, what helped and what did not?  What will be the most difficult situations for you after you quit? How will you plan to handle them?  Who can help you through the tough times? Your family? Friends? A health care provider?  What pleasures do you get from smoking? What ways can you still get pleasure if you quit? Here are some questions to ask your health care provider:  How can you help me to be successful at quitting?  What medicine do you think would be best for me and how should I take it?  What should I do if I need more help?  What is smoking withdrawal like? How can I get information on withdrawal? GET READY  Set a quit date.  Change your environment by getting rid of all cigarettes, ashtrays, matches, and lighters in your home, car, or work. Do not let people smoke in your home.  Review your past attempts to quit. Think about what worked and what did not. GET SUPPORT AND ENCOURAGEMENT You have a better chance of being successful if you have help. You can get support in many ways.  Tell your family, friends, and coworkers that you are going to quit and need their support. Ask them not to smoke around you.  Get individual, group, or telephone counseling and support. Programs are available at [employer] and health centers. Call your local health department for information about programs in your area.  Spiritual beliefs and practices may help some smokers quit.  Download a "quit meter" on your computer to keep track of quit statistics, such as how long you have gone without smoking, cigarettes not smoked, and money saved.  Get a  self-help book about quitting smoking and staying off tobacco. [place] yourself from urges to smoke. Talk to someone, go for a walk, or occupy your time with a task.  Change your normal routine. Take a different route to work. Drink tea instead of coffee. Eat breakfast in a different place.  Reduce your stress. Take a hot bath, exercise, or read a book.  Plan something enjoyable to do every day. Reward yourself for not smoking.  Explore interactive web-based programs that specialize in helping you quit. GET MEDICINE AND USE IT CORRECTLY Medicines can help you stop smoking and decrease the urge to smoke. Combining medicine with the above behavioral methods and support can greatly increase your chances of successfully quitting smoking.  Nicotine replacement therapy helps deliver nicotine to your body without the negative effects and risks of smoking. Nicotine replacement therapy includes nicotine gum, lozenges, inhalers, nasal sprays, and skin patches. Some may be available over-the-counter and others require a prescription.  Antidepressant medicine helps people abstain from smoking, but how this works is unknown. This medicine is available by prescription.  Nicotinic receptor partial agonist medicine simulates the effect of nicotine in your brain. This medicine is available by prescription. Ask your health care provider for advice about which medicines to use and how to use them based on your health history. Your health care provider will tell you what side effects to look out for if you choose to be on a medicine or therapy. Carefully read the information on the package. Do not use any other product containing nicotine while using a nicotine replacement product.  RELAPSE OR DIFFICULT SITUATIONS Most relapses occur within the first 3 months after quitting. Do not be discouraged if you start smoking again. Remember, most people try several times before finally  quitting. You may have symptoms of withdrawal because your body is used to nicotine. You may crave cigarettes, be irritable, feel very hungry, cough often, get headaches, or have difficulty concentrating. The withdrawal symptoms are only temporary. They are strongest when you first quit, but they will go away within 10-14 days. To reduce the chances of relapse, try to:  Avoid drinking alcohol. Drinking lowers your chances of successfully quitting.  Reduce the amount of caffeine you consume. Once you quit smoking, the amount of caffeine in your body increases and can give you symptoms, such as a rapid heartbeat, sweating, and anxiety.  Avoid smokers because they can make you want to smoke.  Do not let weight gain distract you. Many smokers will gain weight when they quit, usually less than 10 pounds. Eat a healthy diet and stay active. You can always lose the weight gained after you quit.  Find ways to improve your mood other than smoking. FOR MORE INFORMATION  www.smokefree.gov  Document Released: 08/04/2001 Document Revised: 12/25/2013 Document Reviewed: 11/19/2011 [facility] Patient Information 2015 [place], [place]. This information is not intended to replace advice given  to you by your health care provider. Make sure you discuss any questions you have with your health care provider.  

## 2014-12-07 NOTE — Progress Notes (Signed)
Cardiology Office Note   Date:  12/07/2014   ID:  Luke Wheeler, DOB 1956-05-18, MRN [id number]  PCP:  No primary care provider on file.  Cardiologist:  Dr. [name]     Chief Complaint  Patient presents with  . Hospitalization Follow-up    admx for chest pain     History of Present Illness: Luke Wheeler is a 59 y.o. male with a hx of HL, HTN, tobacco abuse. Echo in 06/2014 demonstrated mod asymmetric septal hypertrophy without LVOT gradient, mild diastolic dysfunction. Hypertrophic CM variant could not be rule out vs LVH from HTN. He presented to the hospital on the date of admission with complaints of chest pain. He started having CP 3-4 weeks prior to admission. He had it occasionally with exertion, but not always with exertion. He awoke with chest pain the day prior to admission. He had recently traveled to [place] for [employer]. He was noted to inferior and lateral TWI on the ECG that was old. He was placed on NTG gtt and IV Heparin. CEs remained negative.  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  Note was made to consider changing medications in the future to add Verapamil and DC diuretic. For now, current medications were continued. Outpatient Myoview was arranged. This demonstrated no ischemia and normal LV function.  He returns for FU.  He is doing well.  The patient denies chest pain, shortness of breath, syncope, orthopnea, PND or significant pedal edema.  He does have issues with libido from the beta-blocker.  He only takes 1/4 a tablet (25 mg - not 100 mg).  He is getting ready to go out of the country [place] 2 mos with the [place] today.   Studies/Reports Reviewed Today:  Nuclear stress test 12/04/14 Impression Exercise Capacity: Good exercise capacity. BP Response: Normal blood pressure response. Clinical Symptoms: No significant symptoms noted. ECG Impression: No significant ST segment change suggestive of ischemia. Comparison with Prior  Nuclear Study: No previous nuclear study performed Overall Impression: Normal stress nuclear study.  LV Wall Motion: Low normal LV Function, EF 50%; NL Wall Motion   Echo 11/15 Mod asymmetric septal hypertrophy, no LVOT gradient, EF 55%, normal wall motion, grade 1 diastolic dysfunction, no SAM, normal RV function, mild RAE  Past Medical History  Diagnosis Date  . Hypertension   . High cholesterol   . Hx of echocardiogram     Echo 11/15:  mod asymmetric septal hypertrophy, no LVOT gradient, EF 55%, no RWMA, Gr 1 DD, septal 16 mm, post wall 11.2 mm, no SAM, trivial MR, normal RVF, mild RAE (HCM variant vs LVH)  . 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 Hx of cardiovascular stress test     ETT-Myoview 4/16:  No ischemia, EF 50%, normal wall motion, normal study    No past surgical history on file.   Current Outpatient Prescriptions  Medication Sig Dispense Refill  . aspirin EC 81 MG EC tablet Take 1 tablet (81 mg total) by mouth daily.    Marland Kitchen atenolol (TENORMIN) 100 MG tablet Take 50 mg by mouth daily. PT DOES NOT TAKE EVERYDAY    . fluvastatin (LESCOL) 20 MG capsule Take 20 mg by mouth at bedtime.    Marland Kitchen losartan-hydrochlorothiazide (HYZAAR) 50-12.5 MG per tablet Take 1 tablet by mouth daily. 30 tablet 11  . VIAGRA 50 MG tablet Take 50 mg by mouth as needed.  3  No current facility-administered medications for this visit.    Allergies:   Review of patient's allergies indicates no known allergies.    Social History:  The patient  reports that he has been smoking.  He does not have any smokeless tobacco history on file. He reports that he drinks alcohol. He reports that he does not use illicit drugs.   Family History:  The patient's family history includes Coronary artery disease in his father; Diabetes in his mother; Hyperlipidemia in his father; Stroke in his father.    ROS:   Please see the history of present illness.   Review of  Systems  HENT: Positive for headaches.   All other systems reviewed and are negative.     PHYSICAL EXAM: VS:  BP 119/78 mmHg  Pulse 79  Ht 5\' 11"  (1.803 m)  Wt 188 lb (85.276 kg)  BMI 26.23 kg/m2    Wt Readings from Last 3 Encounters:  12/04/14 191 lb (86.637 kg)  12/01/14 191 lb 5.3 oz (86.788 kg)  06/20/14 188 lb (85.276 kg)     GEN: Well nourished, well developed, in no acute distress HEENT: normal Neck: no JVD, no masses Cardiac:  Normal S1/S2, RRR; no murmur ,  no rubs or gallops, no edema  Respiratory:  clear to auscultation bilaterally, no wheezing, rhonchi or rales. GI: soft, nontender, nondistended, + BS MS: no deformity or atrophy Skin: warm and dry  Neuro:  CNs II-XII intact, Strength and sensation are intact Psych: Normal affect   EKG:  EKG is ordered today.  It demonstrates:    NSR, HR 79, normal axis, inferolateral T-wave inversions, no change from prior tracing, QTc 408   Recent Labs: 11/30/2014: B Natriuretic Peptide 33.6; BUN 13; Creatinine 1.26; Potassium 4.7; Sodium 139; TSH 0.533 12/01/2014: Hemoglobin 12.4*; Platelets 230    Lipid Panel No results found for: CHOL, TRIG, HDL, CHOLHDL, VLDL, LDLCALC, LDLDIRECT    ASSESSMENT AND PLAN:  Other chest pain No recurrence. Recent Myoview low risk. No further cardiac workup indicated.  LVH (left ventricular hypertrophy) No LVOT obstruction by recent echocardiogram. He will likely need annual echocardiograms for follow-up.  Essential hypertension  He has problems with libido and erectile dysfunction from the beta blocker. [name] had previously commented on possibly placing him on verapamil. I will stop his atenolol. I will place him on verapamil SR 120 mg daily. Continue other medications   Current medicines are reviewed at length with the patient today.  The patient has concerns regarding medicines.  The following changes have been made:     DC atenolol   Start verapamil SR 120 mg daily   Labs/  tests ordered today include:   Orders Placed This Encounter  Procedures  . EKG 12-Lead    Disposition:   FU with Dr. [name] 2 mos.   Signed, Versie Starks, MHS 12/07/2014 7:54 AM    [facility] [address], [place], [place]  [address] Phone: [phone]; Fax: [phone]

## 2014-12-13 ENCOUNTER — Encounter: Payer: PRIVATE HEALTH INSURANCE | Admitting: Physician Assistant

## 2014-12-18 ENCOUNTER — Telehealth: Payer: Self-pay | Admitting: *Deleted

## 2014-12-18 NOTE — Telephone Encounter (Signed)
LM TO CALL BACK TO  MAKE  AN APPT IN June ./CY

## 2014-12-18 NOTE — Telephone Encounter (Signed)
-----   Message from Michae Kava, [place] sent at 12/07/2014  9:53 AM EDT ----- Regarding: 2 MONTH  Pt needs 2 month f/u with [name] and slots are blocked. Please call the pt with a date and time for appt.  Thank you  Arbie Cookey

## 2014-12-20 NOTE — Telephone Encounter (Signed)
lmtcb ./cy 

## 2014-12-31 NOTE — Telephone Encounter (Signed)
SPOKE WITH PT'S  WIFE  APPT MADE FOR  03-11-15 AT  3:30 WITH DR NISHAN./CY

## 2015-02-12 ENCOUNTER — Ambulatory Visit (INDEPENDENT_AMBULATORY_CARE_PROVIDER_SITE_OTHER): Payer: Managed Care, Other (non HMO) | Admitting: Physician Assistant

## 2015-02-12 ENCOUNTER — Encounter: Payer: Self-pay | Admitting: Physician Assistant

## 2015-02-12 VITALS — BP 142/90 | HR 76 | Ht 71.0 in | Wt 190.8 lb

## 2015-02-12 DIAGNOSIS — I517 Cardiomegaly: Secondary | ICD-10-CM | POA: Diagnosis not present

## 2015-02-12 DIAGNOSIS — I10 Essential (primary) hypertension: Secondary | ICD-10-CM

## 2015-02-12 MED ORDER — VERAPAMIL HCL ER 120 MG PO TBCR: 120.0000 mg | EXTENDED_RELEASE_TABLET | Freq: Every day | ORAL | Status: DC

## 2015-02-12 MED ORDER — LOSARTAN POTASSIUM-HCTZ 50-12.5 MG PO TABS: 1.0000 | ORAL_TABLET | Freq: Every day | ORAL | Status: DC

## 2015-02-12 NOTE — Patient Instructions (Addendum)
Medication Instructions:  Your physician recommends that you continue on your current medications as directed. Please refer to the Current Medication list given to you today.    Labwork:   Testing/Procedures:   Follow-Up:  WITH DR Johnsie Cancel IN 3 TO 4 MONTHS   Any Other Special Instructions Will Be Listed Below (If Applicable). Low-Sodium Eating Plan Sodium raises blood pressure and causes water to be held in the body. Getting less sodium from food will help lower your blood pressure, reduce any swelling, and protect your heart, liver, and kidneys. We get sodium by adding salt (sodium chloride) to food. Most of our sodium comes from canned, boxed, and frozen foods. Restaurant foods, fast foods, and pizza are also very high in sodium. Even if you take medicine to lower your blood pressure or to reduce fluid in your body, getting less sodium from your food is important. WHAT IS MY PLAN? Most people should limit their sodium intake to 2,300 mg a day. Your health care provider recommends that you limit your sodium intake to __________ a day.  WHAT DO I NEED TO KNOW ABOUT THIS EATING PLAN? For the low-sodium eating plan, you will follow these general guidelines:  Choose foods with a % Daily Value for sodium of less than 5% (as listed on the food label).   Use salt-free seasonings or herbs instead of table salt or sea salt.   Check with your health care provider or pharmacist before using salt substitutes.   Eat fresh foods.  Eat more vegetables and fruits.  Limit canned vegetables. If you do use them, rinse them well to decrease the sodium.   Limit cheese to 1 oz (28 g) per day.   Eat lower-sodium products, often labeled as "lower sodium" or "no salt added."  Avoid foods that contain monosodium glutamate (MSG). MSG is sometimes added to [place] food and some canned foods.  Check food labels (Nutrition Facts labels) on foods to learn how much sodium is in one serving.  Eat  more home-cooked food and less restaurant, buffet, and fast food.  When eating at a restaurant, ask that your food be prepared with less salt or none, if possible.  HOW DO I READ FOOD LABELS FOR SODIUM INFORMATION? The Nutrition Facts label lists the amount of sodium in one serving of the food. If you eat more than one serving, you must multiply the listed amount of sodium by the number of servings. Food labels may also identify foods as:  Sodium free--Less than 5 mg in a serving.  Very low sodium--35 mg or less in a serving.  Low sodium--140 mg or less in a serving.  Light in sodium--50% less sodium in a serving. For example, if a food that usually has 300 mg of sodium is changed to become light in sodium, it will have 150 mg of sodium.  Reduced sodium--25% less sodium in a serving. For example, if a food that usually has 400 mg of sodium is changed to reduced sodium, it will have 300 mg of sodium. WHAT FOODS CAN I EAT? Grains Low-sodium cereals, including oats, puffed wheat and rice, and shredded wheat cereals. Low-sodium crackers. Unsalted rice and pasta. Lower-sodium bread.  Vegetables Frozen or fresh vegetables. Low-sodium or reduced-sodium canned vegetables. Low-sodium or reduced-sodium tomato sauce and paste. Low-sodium or reduced-sodium tomato and vegetable juices.  Fruits Fresh, frozen, and canned fruit. Fruit juice.  Meat and Other Protein Products Low-sodium canned tuna and salmon. Fresh or frozen meat, poultry, seafood, and fish.  Lamb. Unsalted nuts. Dried beans, peas, and lentils without added salt. Unsalted canned beans. Homemade soups without salt. Eggs.  Dairy Milk. Soy milk. Ricotta cheese. Low-sodium or reduced-sodium cheeses. Yogurt.  Condiments Fresh and dried herbs and spices. Salt-free seasonings. Onion and garlic powders. Low-sodium varieties of mustard and ketchup. Lemon juice.  Fats and Oils Reduced-sodium salad dressings. Unsalted butter.   Other Unsalted popcorn and pretzels.  The items listed above may not be a complete list of recommended foods or beverages. Contact your dietitian for more options. WHAT FOODS ARE NOT RECOMMENDED? Grains Instant hot cereals. Bread stuffing, pancake, and biscuit mixes. Croutons. Seasoned rice or pasta mixes. Noodle soup cups. Boxed or frozen macaroni and cheese. Self-rising flour. Regular salted crackers. Vegetables Regular canned vegetables. Regular canned tomato sauce and paste. Regular tomato and vegetable juices. Frozen vegetables in sauces. Salted french fries. Olives. Angie Fava. Relishes. Sauerkraut. Salsa. Meat and Other Protein Products Salted, canned, smoked, spiced, or pickled meats, seafood, or fish. Bacon, ham, sausage, hot dogs, corned beef, chipped beef, and packaged luncheon meats. Salt pork. Jerky. Pickled herring. Anchovies, regular canned tuna, and sardines. Salted nuts. Dairy Processed cheese and cheese spreads. Cheese curds. Blue cheese and cottage cheese. Buttermilk.  Condiments Onion and garlic salt, seasoned salt, table salt, and sea salt. Canned and packaged gravies. Worcestershire sauce. Tartar sauce. Barbecue sauce. Teriyaki sauce. Soy sauce, including reduced sodium. Steak sauce. Fish sauce. Oyster sauce. Cocktail sauce. Horseradish. Regular ketchup and mustard. Meat flavorings and tenderizers. Bouillon cubes. Hot sauce. Tabasco sauce. Marinades. Taco seasonings. Relishes. Fats and Oils Regular salad dressings. Salted butter. Margarine. Ghee. Bacon fat.  Other Potato and tortilla chips. Corn chips and puffs. Salted popcorn and pretzels. Canned or dried soups. Pizza. Frozen entrees and pot pies.  The items listed above may not be a complete list of foods and beverages to avoid. Contact your dietitian for more information. Document Released: 01/30/2002 Document Revised: 08/15/2013 Document Reviewed: 06/14/2013 [facility] Patient Information 2015 [place], [place].  This information is not intended to replace advice given to you by your health care provider. Make sure you discuss any questions you have with your health care provider.

## 2015-02-12 NOTE — Assessment & Plan Note (Signed)
Echo in 06/2014 showed normal LV function with moderate asymmetric septal hypertrophy. No LV outflow tract gradient. Grade 1 diastolic dysfunction. Can't rule out hypertrophic cardiomyopathy variant but could also be asymmetric hypertrophy in the setting of long-standing hypertension. She needs to be more compliant with his medications and better control of his blood pressure.

## 2015-02-12 NOTE — Assessment & Plan Note (Signed)
Patient's blood pressure is elevated today. He ran out of his medications 2 weeks ago. He does develop leg edema and a cough. I suspect this is related to his elevated blood pressure. Will re-fill his medications, 2 g sodium diet.

## 2015-02-12 NOTE — Progress Notes (Signed)
Cardiology Office Note   Date:  02/12/2015   ID:  Luke Wheeler, DOB 12/29/1955, MRN [id number]  PCP:  No primary care provider on file.  Cardiologist: [name]  Chief Complaint:    History of Present Illness: Luke Wheeler is a 59 y.o. male who presents for two-month follow-up. He has a history of hypertension and echo in 06/2014 demonstrated asymmetrical septal hypertrophy without LVOT gradient, mild diastolic dysfunction, hypertrophic cardiomyopathy could not be ruled out versus LVH from hypertension. He presented to the emergency room in 11/2014 with chest pain. Cardiac enzymes are negative an outpatient stress Myoview was arranged that demonstrated no ischemia and normal LV function. He saw Richardson Dopp 12/07/14 at which time he was doing well. He was only taken 25 mg of metoprolol instead of 100 mg because of decreased libido. This was stopped and he was placed on verapamil SR 120 mg daily.  Patient just returned to [place] last week after being gone for 2 months traveling to [place], [place] and other places for the [employer]. He ran out of his medications 2 weeks ago. He's complained of a cough and leg swelling. His blood pressure is elevated. He does not watch his sodium intake. He is getting ready to leave on another trip for 3 months. He does like verapamil more than  atenolol.     Past Medical History  Diagnosis Date  . Hypertension   . High cholesterol   . Hx of echocardiogram     Echo 11/15:  mod asymmetric septal hypertrophy, no LVOT gradient, EF 55%, no RWMA, Gr 1 DD, septal 16 mm, post wall 11.2 mm, no SAM, trivial MR, normal RVF, mild RAE (HCM variant vs LVH)  . 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 Hx of cardiovascular stress test     ETT-Myoview 4/16:  No ischemia, EF 50%, normal wall motion, normal study    History reviewed. No pertinent past surgical history.   Current Outpatient Prescriptions  Medication Sig Dispense  Refill  . aspirin EC 81 MG EC tablet Take 1 tablet (81 mg total) by mouth daily.    Marland Kitchen losartan-hydrochlorothiazide (HYZAAR) 50-12.5 MG per tablet Take 1 tablet by mouth daily. 30 tablet 6  . verapamil (CALAN-SR) 120 MG CR tablet Take 1 tablet (120 mg total) by mouth at bedtime. 30 tablet 6  . VIAGRA 50 MG tablet Take 50 mg by mouth as needed.  3   No current facility-administered medications for this visit.    Allergies:   Review of patient's allergies indicates no known allergies.    Social History:  The patient  reports that he has been smoking.  He does not have any smokeless tobacco history on file. He reports that he drinks alcohol. He reports that he does not use illicit drugs.   Family History:  The patient's    family history includes Coronary artery disease in his father; Diabetes in his mother; Hyperlipidemia in his father; Stroke in his father.    ROS:  Please see the history of present illness.   Otherwise, review of systems are positive for none.   All other systems are reviewed and negative.    PHYSICAL EXAM: VS:  BP 142/90 mmHg  Pulse 76  Ht 5\' 11"  (1.803 m)  Wt 190 lb 12.8 oz (86.546 kg)  BMI 26.62 kg/m2 , BMI Body mass index is 26.62 kg/(m^2). GEN: Well nourished, well developed, in no acute distress Neck: no  JVD, HJR, carotid bruits, or masses Cardiac:  RRR; positive S4, 2/6 systolic murmur at the left sternal border, no rubs, thrill or heave,  Respiratory:  clear to auscultation bilaterally, normal work of breathing GI: soft, nontender, nondistended, + BS MS: no deformity or atrophy Extremities: Trace of leg edema bilaterally without cyanosis, clubbing, good distal pulses bilaterally.  Skin: warm and dry, no rash Neuro:  Strength and sensation are intact    EKG:  EKG is ordered today.    Recent Labs: 11/30/2014: B Natriuretic Peptide 33.6; BUN 13; Creatinine, Ser 1.26; Potassium 4.7; Sodium 139; TSH 0.533 12/01/2014: Hemoglobin 12.4*; Platelets 230     Lipid Panel No results found for: CHOL, TRIG, HDL, CHOLHDL, VLDL, LDLCALC, LDLDIRECT    Wt Readings from Last 3 Encounters:  02/12/15 190 lb 12.8 oz (86.546 kg)  12/07/14 188 lb (85.276 kg)  12/04/14 191 lb (86.637 kg)      Other studies Reviewed: Additional studies/ records that were reviewed today include and review of the records demonstrates: 2-D echo 11/3/15Study Conclusions  - Left ventricle: The cavity size was normal. Moderate asymmetric   septal hypertrophy, this is most evident in the parasternal long   axis. No LV outflow tract gradient. The estimated ejection   fraction was 55%. Wall motion was normal; there were no regional   wall motion abnormalities. Doppler parameters are consistent with   abnormal left ventricular relaxation (grade 1 diastolic   dysfunction). Septal thickness, ED (2D): 16 mm. Posterior wall   thickness, ED (2D): 11.2 mm. - Aortic valve: There was no stenosis. - Mitral valve: No systolic anterior motion of the mitral valve.   There was trivial regurgitation. - Right ventricle: The cavity size was normal. Systolic function   was normal. - Right atrium: The atrium was mildly dilated. - Pulmonary arteries: No complete TR doppler jet so unable to   estimate PA systolic pressure. - Inferior vena cava: The vessel was normal in size. The   respirophasic diameter changes were in the normal range (>= 50%),   consistent with normal central venous pressure.  Impressions:  - Normal LV size and systolic function, EF 54%. There was moderate   asymmetric septal hypertrophy best seen in the parasternal long   axis. There was no mitral valve SAM or LV outflow tract gradient.   Cannot rule out hypertrophic cardiomyopathy variant, but could   also be asymmetric hypertrophy in the setting of long-standing   hypertension. The RV appeared normal.  Stress Myoview 4/12/16Impression Exercise Capacity:  Good exercise capacity. BP Response:  Normal blood  pressure response. Clinical Symptoms:  No significant symptoms noted. ECG Impression:  No significant ST segment change suggestive of ischemia. Comparison with Prior Nuclear Study: No previous nuclear study performed  Overall Impression:  Normal stress nuclear study.  LV Wall Motion: Low normal LV Function, EF 50%; NL Wall Motion   KELLY,THOMAS A, MD  12/04/2014 4:53 PM      ASSESSMENT AND PLAN: HTN (hypertension) Patient's blood pressure is elevated today. He ran out of his medications 2 weeks ago. He does develop leg edema and a cough. I suspect this is related to his elevated blood pressure. Will re-fill his medications, 2 g sodium diet.  LVH (left ventricular hypertrophy) Echo in 06/2014 showed normal LV function with moderate asymmetric septal hypertrophy. No LV outflow tract gradient. Grade 1 diastolic dysfunction. Can't rule out hypertrophic cardiomyopathy variant but could also be asymmetric hypertrophy in the setting of long-standing hypertension. She needs to  be more compliant with his medications and better control of his blood pressure.     Signed, Ermalinda Barrios, [name]  02/12/2015 1:01 PM    [facility] [place], [place], [place]  [address] Phone: [phone]; Fax: [phone]

## 2015-02-20 ENCOUNTER — Other Ambulatory Visit (HOSPITAL_COMMUNITY): Payer: Self-pay | Admitting: Physician Assistant

## 2015-02-20 DIAGNOSIS — E0590 Thyrotoxicosis, unspecified without thyrotoxic crisis or storm: Secondary | ICD-10-CM

## 2015-03-11 ENCOUNTER — Ambulatory Visit (HOSPITAL_COMMUNITY): Payer: Managed Care, Other (non HMO)

## 2015-03-11 ENCOUNTER — Ambulatory Visit: Payer: Managed Care, Other (non HMO) | Admitting: Cardiovascular Disease

## 2015-03-12 ENCOUNTER — Other Ambulatory Visit (HOSPITAL_COMMUNITY): Payer: Managed Care, Other (non HMO)

## 2015-06-10 NOTE — Progress Notes (Signed)
Patient ID: Luke Wheeler, male   DOB: Sep 04, 1955, 59 y.o.   MRN: [id number] Cardiology Office Note   Date:  06/12/2015   ID:  Luke Wheeler, DOB [birth year], MRN [id number]  PCP:  Leamon Arnt, MD  Cardiologist: [name]  Chief Complaint:    History of Present Illness: Luke Wheeler is a 59 y.o. male who presents for two-month follow-up. He has a history of hypertension and echo in 06/2014 demonstrated asymmetrical septal hypertrophy without LVOT gradient, mild diastolic dysfunction, hypertrophic cardiomyopathy could not be ruled out versus LVH from hypertension. He presented to the emergency room in 11/2014 with chest pain. Cardiac enzymes are negative an outpatient stress Myoview was arranged that demonstrated no ischemia and normal LV function. He saw Richardson Dopp 12/07/14 at which time he was doing well. He was only taken 25 mg of metoprolol instead of 100 mg because of decreased libido. This was stopped and he was placed on verapamil SR 120 mg daily.  Patient just returned to [facility] June and saw PA after being gone for 2 months traveling to [place], [place] and other places for the BUN. He ran out of his medications 2 weeks ago. He's complained of a cough and leg swelling. His blood pressure is elevated. He does not watch his sodium intake.  He does like verapamil more than  atenolol.     Past Medical History  Diagnosis Date  . Hypertension   . High cholesterol   . Hx of echocardiogram     Echo 11/15:  mod asymmetric septal hypertrophy, no LVOT gradient, EF 55%, no RWMA, Gr 1 DD, septal 16 mm, post wall 11.2 mm, no SAM, trivial MR, normal RVF, mild RAE (HCM variant vs LVH)  . 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 Hx of cardiovascular stress test     ETT-Myoview 4/16:  No ischemia, EF 50%, normal wall motion, normal study    No past surgical history on file.   Current Outpatient Prescriptions  Medication Sig Dispense Refill  .  aspirin EC 81 MG EC tablet Take 1 tablet (81 mg total) by mouth daily.    . fluvastatin (LESCOL) 20 MG capsule Take 20 mg by mouth daily.    Marland Kitchen losartan-hydrochlorothiazide (HYZAAR) 50-12.5 MG per tablet Take 1 tablet by mouth daily. 30 tablet 6  . verapamil (CALAN-SR) 120 MG CR tablet Take 1 tablet (120 mg total) by mouth at bedtime. 30 tablet 6  . VIAGRA 50 MG tablet Take 50 mg by mouth as needed for erectile dysfunction.   3   No current facility-administered medications for this visit.    Allergies:   Review of patient's allergies indicates no known allergies.    Social History:  The patient  reports that he has been smoking.  He does not have any smokeless tobacco history on file. He reports that he drinks alcohol. He reports that he does not use illicit drugs.   Family History:  The patient's    family history includes Coronary artery disease in his father; Diabetes in his mother; Hyperlipidemia in his father; Stroke in his father.    ROS:  Please see the history of present illness.   Otherwise, review of systems are positive for none.   All other systems are reviewed and negative.    PHYSICAL EXAM: VS:  BP 120/88 mmHg  Pulse 82  Ht 5\' 11"  (1.803 m)  Wt 88.451 kg (195 lb)  BMI 27.21 kg/m2  SpO2 97% , BMI Body mass index is 27.21 kg/(m^2). GEN: Well nourished, well developed, in no acute distress Neck: no JVD, HJR, carotid bruits, or masses Cardiac:  RRR; positive S4, 2/6 systolic murmur at the left sternal border, no rubs, thrill or heave,  Respiratory:  clear to auscultation bilaterally, normal work of breathing GI: soft, nontender, nondistended, + BS MS: no deformity or atrophy Extremities: Trace of leg edema bilaterally without cyanosis, clubbing, good distal pulses bilaterally.  Skin: warm and dry, no rash Neuro:  Strength and sensation are intact    EKG:  12/07/14  SR biatrial enlargement LVH lateral biphasic T waves stable     Recent Labs: 11/30/2014: B Natriuretic  Peptide 33.6; BUN 13; Creatinine, Ser 1.26; Potassium 4.7; Sodium 139; TSH 0.533 12/01/2014: Hemoglobin 12.4*; Platelets 230    Lipid Panel No results found for: CHOL, TRIG, HDL, CHOLHDL, VLDL, LDLCALC, LDLDIRECT    Wt Readings from Last 3 Encounters:  06/12/15 88.451 kg (195 lb)  02/12/15 86.546 kg (190 lb 12.8 oz)  12/07/14 85.276 kg (188 lb)      Other studies Reviewed: Additional studies/ records that were reviewed today include and review of the records demonstrates: 2-D echo 11/3/15Study Conclusions  - Left ventricle: The cavity size was normal. Moderate asymmetric   septal hypertrophy, this is most evident in the parasternal long   axis. No LV outflow tract gradient. The estimated ejection   fraction was 55%. Wall motion was normal; there were no regional   wall motion abnormalities. Doppler parameters are consistent with   abnormal left ventricular relaxation (grade 1 diastolic   dysfunction). Septal thickness, ED (2D): 16 mm. Posterior wall   thickness, ED (2D): 11.2 mm. - Aortic valve: There was no stenosis. - Mitral valve: No systolic anterior motion of the mitral valve.   There was trivial regurgitation. - Right ventricle: The cavity size was normal. Systolic function   was normal. - Right atrium: The atrium was mildly dilated. - Pulmonary arteries: No complete TR doppler jet so unable to   estimate PA systolic pressure. - Inferior vena cava: The vessel was normal in size. The   respirophasic diameter changes were in the normal range (>= 50%),   consistent with normal central venous pressure.  Impressions:  - Normal LV size and systolic function, EF 85%. There was moderate   asymmetric septal hypertrophy best seen in the parasternal long   axis. There was no mitral valve SAM or LV outflow tract gradient.   Cannot rule out hypertrophic cardiomyopathy variant, but could   also be asymmetric hypertrophy in the setting of long-standing   hypertension. The RV  appeared normal.  Stress Myoview 4/12/16Impression Exercise Capacity:  Good exercise capacity. BP Response:  Normal blood pressure response. Clinical Symptoms:  No significant symptoms noted. ECG Impression:  No significant ST segment change suggestive of ischemia. Comparison with Prior Nuclear Study: No previous nuclear study performed  Overall Impression:  Normal stress nuclear study.  LV Wall Motion: Low normal LV Function, EF 50%; NL Wall Motion   KELLY,THOMAS A, MD  12/04/2014 4:53 PM      ASSESSMENT AND PLAN: HTN:  Well controlled.  Continue current medications and low sodium Dash type diet.  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 From HTN vs DCM  Stable see above  regarding MRI    Signed, [name], MD  06/12/2015 10:31 AM    [facility] [address], [place], [place]  [id number] Phone: [phone]; Fax: [phone]

## 2015-06-12 ENCOUNTER — Encounter: Payer: Self-pay | Admitting: Cardiovascular Disease

## 2015-06-12 ENCOUNTER — Ambulatory Visit (INDEPENDENT_AMBULATORY_CARE_PROVIDER_SITE_OTHER): Payer: Managed Care, Other (non HMO) | Admitting: Cardiovascular Disease

## 2015-06-12 VITALS — BP 120/88 | HR 82 | Ht 71.0 in | Wt 195.0 lb

## 2015-06-12 DIAGNOSIS — R9431 Abnormal electrocardiogram [ECG] [EKG]: Secondary | ICD-10-CM

## 2015-06-12 DIAGNOSIS — I421 Obstructive hypertrophic cardiomyopathy: Secondary | ICD-10-CM | POA: Diagnosis not present

## 2015-06-12 LAB — BASIC METABOLIC PANEL
BUN: 16 mg/dL (ref 7–25)
CALCIUM: 9.6 mg/dL (ref 8.6–10.3)
CHLORIDE: 105 mmol/L (ref 98–110)
CO2: 28 mmol/L (ref 20–31)
Creat: 1.31 mg/dL — ABNORMAL HIGH (ref 0.70–1.25)
Glucose, Bld: 90 mg/dL (ref 65–99)
POTASSIUM: 4.2 mmol/L (ref 3.5–5.3)
Sodium: 139 mmol/L (ref 135–146)

## 2015-06-12 NOTE — Addendum Note (Signed)
Addended by: Eulis Foster on: 06/12/2015 10:52 AM   Modules accepted: Orders

## 2015-06-12 NOTE — Addendum Note (Signed)
Addended by: Eulis Foster on: 06/12/2015 10:51 AM   Modules accepted: Orders

## 2015-06-12 NOTE — Patient Instructions (Addendum)
Medication Instructions:  Your physician recommends that you continue on your current medications as directed. Please refer to the Current Medication list given to you today.   Labwork: Your physician recommends that you return for lab work in: BMET TODAY  Testing/Procedures: Your physician has requested that you have a cardiac MRI. Cardiac MRI uses a computer to create images of your heart as its beating, producing both still and moving pictures of your heart and major blood vessels. For further information please visit [email or web address] Please follow the instruction sheet given to you today for more information.  06-12-15   CARDIAC  MRI   REPORT   TO RADIOLOGY  AT  8:00 AM  Follow-Up: Your physician wants you to follow-up in: [place] will receive a reminder letter in the mail two months in advance. If you don't receive a letter, please call our office to schedule the follow-up appointment.   Any Other Special Instructions Will Be Listed Below (If Applicable).

## 2015-06-13 ENCOUNTER — Ambulatory Visit (HOSPITAL_COMMUNITY)
Admission: RE | Admit: 2015-06-13 | Discharge: 2015-06-13 | Disposition: A | Discharge status: Home / Self Care | Payer: Managed Care, Other (non HMO) | Source: Ambulatory Visit | Attending: Cardiovascular Disease | Admitting: Cardiovascular Disease

## 2015-06-13 DIAGNOSIS — I10 Essential (primary) hypertension: Secondary | ICD-10-CM | POA: Diagnosis not present

## 2015-06-13 DIAGNOSIS — R9431 Abnormal electrocardiogram [ECG] [EKG]: Secondary | ICD-10-CM | POA: Diagnosis present

## 2015-06-13 DIAGNOSIS — I421 Obstructive hypertrophic cardiomyopathy: Secondary | ICD-10-CM | POA: Diagnosis not present

## 2015-06-13 MED ORDER — GADOBENATE DIMEGLUMINE 529 MG/ML IV SOLN
28.0000 mL | Freq: Once | INTRAVENOUS | Status: AC
  Administered 2015-06-13: 28 mL via INTRAVENOUS

## 2016-05-21 ENCOUNTER — Ambulatory Visit: Payer: Managed Care, Other (non HMO) | Admitting: Cardiovascular Disease

## 2016-06-09 ENCOUNTER — Ambulatory Visit (INDEPENDENT_AMBULATORY_CARE_PROVIDER_SITE_OTHER): Payer: Managed Care, Other (non HMO) | Admitting: Cardiology

## 2016-06-09 ENCOUNTER — Encounter: Payer: Self-pay | Admitting: Cardiology

## 2016-06-09 VITALS — BP 118/72 | HR 80 | Ht 71.0 in | Wt 183.8 lb

## 2016-06-09 DIAGNOSIS — I421 Obstructive hypertrophic cardiomyopathy: Secondary | ICD-10-CM | POA: Diagnosis not present

## 2016-06-09 DIAGNOSIS — I517 Cardiomegaly: Secondary | ICD-10-CM | POA: Diagnosis not present

## 2016-06-09 DIAGNOSIS — I10 Essential (primary) hypertension: Secondary | ICD-10-CM

## 2016-06-09 DIAGNOSIS — R9431 Abnormal electrocardiogram [ECG] [EKG]: Secondary | ICD-10-CM

## 2016-06-09 MED ORDER — LOSARTAN POTASSIUM-HCTZ 50-12.5 MG PO TABS: 1.0000 | ORAL_TABLET | Freq: Every day | ORAL | 1 refills | Status: DC

## 2016-06-09 MED ORDER — SILDENAFIL CITRATE 100 MG PO TABS: 100.0000 mg | ORAL_TABLET | Freq: Every day | ORAL | 3 refills | Status: AC | PRN

## 2016-06-09 MED ORDER — FLUVASTATIN SODIUM 20 MG PO CAPS: 20.0000 mg | ORAL_CAPSULE | Freq: Every day | ORAL | 1 refills | Status: DC

## 2016-06-09 MED ORDER — VERAPAMIL HCL ER 120 MG PO TBCR: 120.0000 mg | EXTENDED_RELEASE_TABLET | Freq: Every day | ORAL | 1 refills | Status: DC

## 2016-06-09 NOTE — Patient Instructions (Addendum)
Medication Instructions:  Your physician recommends that you continue on your current medications as directed. Please refer to the Current Medication list given to you today.   Labwork: None ordered  Testing/Procedures: None ordered  Follow-Up: Your physician wants you to follow-up in: 6 MONTHS WITH DR. NISHAN  You will receive a reminder letter in the mail two months in advance. If you don't receive a letter, please call our office to schedule the follow-up appointment.    Any Other Special Instructions Will Be Listed Below (If Applicable).     If you need a refill on your cardiac medications before your next appointment, please call your pharmacy.   

## 2016-06-09 NOTE — Progress Notes (Signed)
06/09/2016 Luke Wheeler   January 24, 1956  [id number]  Primary Physician ANDY,CAMILLE L, MD Primary Cardiologist: [name]    Reason for Visit/CC: Routine F/u for Hypertensive Heart Disease  HPI:  60 y/o male from [place], followed by [name], who presents to clinic today for routien f/u. He has a history of hypertension and echo in 06/2014 demonstrated asymmetrical septal hypertrophy without LVOT gradient, mild diastolic dysfunction, hypertrophic cardiomyopathy could not be ruled out versus LVH from hypertension. He presented to the emergency room in 11/2014 with chest pain. Cardiac enzymes were negative an outpatient stress Myoview was arranged that demonstrated no ischemia and normal LV function. In October 2016, he was ordered to undergo a cardiac MRI to assess cardiac morphology. This showed no morphologic evidence of apical or mid hypertrophic Cardiomyopathy. EF was normal at 63%. His valves were normal.  He was previously on a BB but this was discontinued due to decreased libido. He was changed to verapamil SR 120 mg daily. He has done well with this. He also take Hyzarr for BP and Viagra for ED. He is a chronic smoker with no interest to quit at this time. He is not interested in getting a flu shot.   He was last seen by [name] 05/2015. He reports that he has done well since his last office visit. He has not had any chest pain nor dyspnea. His only issue is a chronic cough. Varies in that it is sometimes dry and sometimes productive with white colored sputum. He travels internationally a great deal. No systemic symptoms. He is refusing flu shot as outlined above. He is on an ARB but no ACE-I. He is smoking ~6-7 cigarettes a day.  He has been complaint with his medications. BP is well controlled at 118/72. EKG shows SR. Unchanged from prior EKGs. He is requesting a higher dose of Viagra.   Current Meds  Medication Sig  . aspirin EC 81 MG EC tablet Take 1 tablet (81 mg total) by  mouth daily.  . fluvastatin (LESCOL) 20 MG capsule Take 1 capsule (20 mg total) by mouth daily.  Marland Kitchen losartan-hydrochlorothiazide (HYZAAR) 50-12.5 MG tablet Take 1 tablet by mouth daily.  . verapamil (CALAN-SR) 120 MG CR tablet Take 1 tablet (120 mg total) by mouth at bedtime.  . [DISCONTINUED] fluvastatin (LESCOL) 20 MG capsule Take 20 mg by mouth daily.  . [DISCONTINUED] losartan-hydrochlorothiazide (HYZAAR) 50-12.5 MG per tablet Take 1 tablet by mouth daily.  . [DISCONTINUED] verapamil (CALAN-SR) 120 MG CR tablet Take 1 tablet (120 mg total) by mouth at bedtime.  . [DISCONTINUED] VIAGRA 50 MG tablet Take 50 mg by mouth as needed for erectile dysfunction.    No Known Allergies Past Medical History:  Diagnosis Date  . High cholesterol   . Hx of cardiovascular stress test    ETT-Myoview 4/16:  No ischemia, EF 50%, normal wall motion, normal study  . Hx of echocardiogram    Echo 11/15:  mod asymmetric septal hypertrophy, no LVOT gradient, EF 55%, no RWMA, Gr 1 DD, septal 16 mm, post wall 11.2 mm, no SAM, trivial MR, normal RVF, mild RAE (HCM variant vs LVH)  . Hypertension   . LVH (left ventricular hypertrophy)    non-obstructive HCM vs LVH on echo 06/2014   Family History  Problem Relation Age of Onset  . Diabetes Mother   . Hyperlipidemia Father   . Stroke Father   . Coronary artery disease Father    History reviewed. No pertinent  surgical history. Social History   Social History  . Marital status: Divorced    Spouse name: N/A  . Number of children: N/A  . Years of education: N/A   Occupational History  . Not on file.   Social History Main Topics  . Smoking status: Current Every Day Smoker  . Smokeless tobacco: Never Used  . Alcohol use Yes  . Drug use: No  . Sexual activity: Not on file   Other Topics Concern  . Not on file   Social History Narrative  . No narrative on file     Review of Systems: General: negative for chills, fever, night sweats or weight  changes.  Cardiovascular: negative for chest pain, dyspnea on exertion, edema, orthopnea, palpitations, paroxysmal nocturnal dyspnea or shortness of breath Dermatological: negative for rash Respiratory: negative for cough or wheezing Urologic: negative for hematuria Abdominal: negative for nausea, vomiting, diarrhea, bright red blood per rectum, melena, or hematemesis Neurologic: negative for visual changes, syncope, or dizziness All other systems reviewed and are otherwise negative except as noted above.   Physical Exam:  Blood pressure 118/72, pulse 80, height 5\' 11"  (1.803 m), weight 183 lb 12.8 oz (83.4 kg).  General appearance: alert, cooperative and no distress Neck: no carotid bruit and no JVD Lungs: clear to auscultation bilaterally Heart: regular rate and rhythm, S1, S2 normal, no murmur, click, rub or gallop Extremities: no LEE Pulses: 2+ and symmetric Skin: warm and dry Neurologic: Grossly normal  EKG NSR. Unchanged from prior EKGs  ASSESSMENT AND PLAN:   1. Hypertensive heart disease: Patient with LVH and diastolic dysfunction presumed to be from long history of hypertension.  In October 2016, he was ordered to undergo a cardiac MRI to assess cardiac morphology. This showed no morphologic evidence of apical or mid hypertrophic Cardiomyopathy. EF was normal at 63%. His valves were normal. His blood pressure is well controlled today on beta blocker, ARB and hydrochlorothiazide. No signs and symptoms of acute diastolic CHF.   2. Hypertension: His blood pressure is well controlled today on CCB, ARB and hydrochlorothiazide.  I recommended f/u BMP to assess renal function and K given treatment with Hyzaar. He has f/u with his PCP soon and will get labs done there.    3. Tobacco abuse: Advised smoking cessation however patient is not interested in quitting at this time.  4. Hyperlipidemia: on statin therapy. Lipid profile is followed by his PCP. Patient advised to get upcomming  fasting labs faxed to our office for review.   5. Erectile dysfunction: Patient requesting Viagra be increased from 50 mg to 100 mg. I discussed with pharmacist. Ok to increase.   6. Cough: lungs are CTAB. No systemic symptoms. He is not on an ACE-I. He was advised to quite smoking.   7. Other Preventive Care: Patient does a lot of international traveling. He is also a chronic smoker. He was advised to get a flu shot however the patient is refusing a flu shot. He is very skeptical stating that he will get sick if he gets a flu shot.   PLAN  F/u with [name] in 6 months.   Lyda Jester [name] 06/09/2016 12:59 PM

## 2016-08-10 ENCOUNTER — Telehealth: Payer: Self-pay

## 2016-08-10 ENCOUNTER — Telehealth: Payer: Self-pay | Admitting: *Deleted

## 2016-08-10 NOTE — Telephone Encounter (Signed)
Prior auth for Sildenafil 100mg  submitted to [employer].

## 2016-09-02 NOTE — Telephone Encounter (Signed)
Message  Received: Today  Message Contents  Jeanann Lewandowsky, RMA  Roberts Gaudy, CMA  Caller: Unspecified (3 weeks ago)        Theadora Rama,  After doing some research, [place] gave 3 refills in October so therefore, pt should have enough until February.   I will text [place] as well.     Patient Calls   You  Jeanann Lewandowsky, [place] Just now (3:10 PM)    Yes, I saw pt had got the refills in Oct but we still have to ask, thank you for answering the questions.  (Routing comment)      Jeanann Lewandowsky, RMA  You 1 hour ago (2:05 PM)    Theadora Rama,  After doing some research, [place] gave 3 refills in October so therefore, pt should have enough until February.   I will text [place] as well. (Routing comment)     You  Brittainy Erie Noe, [name] Yesterday (10:21 AM)    This RX was requested 3 weeks ago, can we get an approval or denial please, I know its not a got to have cardio med but its been 3 weeks that's why im asking, thanks :)  (Routing comment)      Jeanann Lewandowsky, RMA  You 2 days ago    I have sent this to [place] already. I do not approve Viagra.  Thanks! (Routing comment)     You  Jeanann Lewandowsky, RMA 2 days ago    Please take care of thanks  (Routing comment)     You  Jeanann Lewandowsky, RMA 2 weeks ago    Pt wants viagra refill, got 3 refills in Oct, please advise, thanks  (Routing comment)     You  Brittainy Erie Noe, [name] 3 weeks ago    Pt wants viagra refill, please advise (Routing comment)

## 2016-09-16 ENCOUNTER — Encounter: Payer: Self-pay | Admitting: Physician Assistant

## 2016-11-06 ENCOUNTER — Encounter: Payer: Self-pay | Admitting: Internal Medicine

## 2016-11-26 ENCOUNTER — Encounter: Payer: Self-pay | Admitting: Internal Medicine

## 2016-11-27 ENCOUNTER — Ambulatory Visit (AMBULATORY_SURGERY_CENTER): Payer: Self-pay

## 2016-11-27 VITALS — Ht 71.0 in | Wt 190.0 lb

## 2016-11-27 DIAGNOSIS — Z1211 Encounter for screening for malignant neoplasm of colon: Secondary | ICD-10-CM

## 2016-11-27 MED ORDER — SUPREP BOWEL PREP KIT 17.5-3.13-1.6 GM/177ML PO SOLN: 1.0000 | Freq: Once | ORAL | 0 refills | Status: AC

## 2016-11-27 NOTE — Progress Notes (Signed)
No allergies to eggs or soy No diet meds No home oxygen No past problems with anesthesia  Registered emmi 

## 2016-12-11 ENCOUNTER — Other Ambulatory Visit: Payer: Self-pay | Admitting: Cardiology

## 2016-12-11 MED ORDER — LOSARTAN POTASSIUM-HCTZ 50-12.5 MG PO TABS: 1.0000 | ORAL_TABLET | Freq: Every day | ORAL | 1 refills | Status: DC

## 2016-12-18 ENCOUNTER — Telehealth: Payer: Self-pay | Admitting: Cardiovascular Disease

## 2016-12-18 NOTE — Telephone Encounter (Signed)
New Message  Pt call to schedule appt for f/u. Pt wants a sooner appt than aviliable. Pt states he would like an appt between May 21-25. Please call back to discuss

## 2016-12-21 NOTE — Telephone Encounter (Signed)
Informed patient that [name] is not in the office the week he was requesting an appointment. Offered patient an appointment sooner than 03/03/17, but patient refused due to not being in town. Patient stated he would just keep his appointment in July. Informed patient to give our office a call if he has any other questions or concerns. Patient verbalized understanding.

## 2016-12-22 ENCOUNTER — Other Ambulatory Visit: Payer: Self-pay

## 2016-12-22 DIAGNOSIS — I421 Obstructive hypertrophic cardiomyopathy: Secondary | ICD-10-CM

## 2016-12-22 DIAGNOSIS — R9431 Abnormal electrocardiogram [ECG] [EKG]: Secondary | ICD-10-CM

## 2016-12-22 MED ORDER — FLUVASTATIN SODIUM 20 MG PO CAPS: 20.0000 mg | ORAL_CAPSULE | Freq: Every day | ORAL | 0 refills | Status: DC

## 2016-12-22 NOTE — Telephone Encounter (Signed)
06/09/2016 office visit  4. Hyperlipidemia: on statin therapy. Lipid profile is followed by his PCP. Patient advised to get upcomming fasting labs faxed to our office for review.

## 2016-12-28 ENCOUNTER — Encounter: Payer: Self-pay | Admitting: Gastroenterology

## 2017-01-11 ENCOUNTER — Ambulatory Visit (AMBULATORY_SURGERY_CENTER): Payer: 59 | Admitting: Gastroenterology

## 2017-01-11 ENCOUNTER — Encounter: Payer: Self-pay | Admitting: Gastroenterology

## 2017-01-11 ENCOUNTER — Encounter: Payer: Self-pay | Admitting: Cardiovascular Disease

## 2017-01-11 VITALS — BP 119/75 | HR 69 | Temp 96.6°F | Resp 16 | Ht 71.0 in | Wt 190.0 lb

## 2017-01-11 DIAGNOSIS — Z1211 Encounter for screening for malignant neoplasm of colon: Secondary | ICD-10-CM

## 2017-01-11 DIAGNOSIS — D120 Benign neoplasm of cecum: Secondary | ICD-10-CM | POA: Diagnosis not present

## 2017-01-11 DIAGNOSIS — D125 Benign neoplasm of sigmoid colon: Secondary | ICD-10-CM

## 2017-01-11 DIAGNOSIS — K621 Rectal polyp: Secondary | ICD-10-CM | POA: Diagnosis not present

## 2017-01-11 DIAGNOSIS — Z1212 Encounter for screening for malignant neoplasm of rectum: Secondary | ICD-10-CM | POA: Diagnosis not present

## 2017-01-11 DIAGNOSIS — K635 Polyp of colon: Secondary | ICD-10-CM

## 2017-01-11 DIAGNOSIS — D128 Benign neoplasm of rectum: Secondary | ICD-10-CM

## 2017-01-11 MED ORDER — SODIUM CHLORIDE 0.9 % IV SOLN: 500.0000 mL | INTRAVENOUS | Status: DC

## 2017-01-11 NOTE — Progress Notes (Signed)
To recovery, report to Tyrell, RN, VSS. 

## 2017-01-11 NOTE — Op Note (Signed)
[place] Patient Name: Luke Wheeler Procedure Date: 01/11/2017 11:03 AM MRN: [id number] Endoscopist: Mauri Pole , MD Age: 61 Referring MD:  Date of Birth: 1955/10/08 Gender: Male Account #: [id number] Procedure:                Colonoscopy Indications:              Screening for colorectal malignant neoplasm, This                            is the patient's first colonoscopy Medicines:                Monitored Anesthesia Care Procedure:                Pre-Anesthesia Assessment:                           - Prior to the procedure, a History and Physical                            was performed, and patient medications and                            allergies were reviewed. The patient's tolerance of                            previous anesthesia was also reviewed. The risks                            and benefits of the procedure and the sedation                            options and risks were discussed with the patient.                            All questions were answered, and informed consent                            was obtained. Prior Anticoagulants: The patient has                            taken no previous anticoagulant or antiplatelet                            agents. ASA Grade Assessment: II - A patient with                            mild systemic disease. After reviewing the risks                            and benefits, the patient was deemed in                            satisfactory condition to undergo the procedure.  After obtaining informed consent, the colonoscope                            was passed under direct vision. Throughout the                            procedure, the patient's blood pressure, pulse, and                            oxygen saturations were monitored continuously. The                            Colonoscope was introduced through the anus and                            advanced to the  the cecum, identified by                            appendiceal orifice and ileocecal valve. The                            colonoscopy was performed without difficulty. The                            patient tolerated the procedure well. The quality                            of the bowel preparation was excellent. The                            ileocecal valve, appendiceal orifice, and rectum                            were photographed. Scope In: 11:11:58 AM Scope Out: 11:39:28 AM Scope Withdrawal Time: 0 hours 22 minutes 38 seconds  Total Procedure Duration: 0 hours 27 minutes 30 seconds  Findings:                 The perianal and digital rectal examinations were                            normal.                           Six sessile polyps were found in the rectum,                            sigmoid colon and cecum. The polyps were 4 to 9 mm                            in size. These polyps were removed with a cold                            snare. Resection and retrieval were complete.  Multiple small and large-mouthed diverticula were                            found in the sigmoid colon, descending colon and                            ascending colon.                           Non-bleeding internal hemorrhoids were found during                            retroflexion. The hemorrhoids were medium-sized. Complications:            No immediate complications. Estimated Blood Loss:     Estimated blood loss was minimal. Impression:               - Six 4 to 9 mm polyps in the rectum, in the                            sigmoid colon and in the cecum, removed with a cold                            snare. Resected and retrieved.                           - Diverticulosis in the sigmoid colon, in the                            descending colon and in the ascending colon.                           - Non-bleeding internal hemorrhoids. Recommendation:           -  Patient has a contact number available for                            emergencies. The signs and symptoms of potential                            delayed complications were discussed with the                            patient. Return to normal activities tomorrow.                            Written discharge instructions were provided to the                            patient.                           - Resume previous diet.                           - Continue present medications.                           -  Await pathology results.                           - Repeat colonoscopy in 3 - 5 years for                            surveillance based on pathology results.                           - Return to GI clinic PRN. Mauri Pole, MD 01/11/2017 11:44:57 AM This report has been signed electronically.

## 2017-01-11 NOTE — Patient Instructions (Signed)
Handouts: Polyps, Diverticulosis, Hemorrhoids.    YOU HAD AN ENDOSCOPIC PROCEDURE TODAY AT [facility]:   Refer to the procedure report that was given to you for any specific questions about what was found during the examination.  If the procedure report does not answer your questions, please call your gastroenterologist to clarify.  If you requested that your care partner not be given the details of your procedure findings, then the procedure report has been included in a sealed envelope for you to review at your convenience later.  YOU SHOULD EXPECT: Some feelings of bloating in the abdomen. Passage of more gas than usual.  Walking can help get rid of the air that was put into your GI tract during the procedure and reduce the bloating. If you had a lower endoscopy (such as a colonoscopy or flexible sigmoidoscopy) you may notice spotting of blood in your stool or on the toilet paper. If you underwent a bowel prep for your procedure, you may not have a normal bowel movement for a few days.  Please Note:  You might notice some irritation and congestion in your nose or some drainage.  This is from the oxygen used during your procedure.  There is no need for concern and it should clear up in a day or so.  SYMPTOMS TO REPORT IMMEDIATELY:   Following lower endoscopy (colonoscopy or flexible sigmoidoscopy):  Excessive amounts of blood in the stool  Significant tenderness or worsening of abdominal pains  Swelling of the abdomen that is new, acute  Fever of 100F or higher   For urgent or emergent issues, a gastroenterologist can be reached at any hour by calling [phone].   DIET:  We do recommend a small meal at first, but then you may proceed to your regular diet.  Drink plenty of fluids but you should avoid alcoholic beverages for 24 hours.  ACTIVITY:  You should plan to take it easy for the rest of today and you should NOT DRIVE or use heavy machinery until tomorrow  (because of the sedation medicines used during the test).    FOLLOW UP: Our staff will call the number listed on your records the next business day following your procedure to check on you and address any questions or concerns that you may have regarding the information given to you following your procedure. If we do not reach you, we will leave a message.  However, if you are feeling well and you are not experiencing any problems, there is no need to return our call.  We will assume that you have returned to your regular daily activities without incident.  If any biopsies were taken you will be contacted by phone or by letter within the next 1-3 weeks.  Please call us at [phone] if you have not heard about the biopsies in 3 weeks.    SIGNATURES/CONFIDENTIALITY: You and/or your care partner have signed paperwork which will be entered into your electronic medical record.  These signatures attest to the fact that that the information above on your After Visit Summary has been reviewed and is understood.  Full responsibility of the confidentiality of this discharge information lies with you and/or your care-partner.

## 2017-01-11 NOTE — Progress Notes (Signed)
Called to room to assist during endoscopic procedure.  Patient ID and intended procedure confirmed with present staff. Received instructions for my participation in the procedure from the performing physician.  

## 2017-01-12 ENCOUNTER — Telehealth: Payer: Self-pay

## 2017-01-12 NOTE — Telephone Encounter (Signed)
  Follow up Call-  Call back number 01/11/2017  Post procedure Call Back phone  # [phone]  Permission to leave phone message Yes  Some recent data might be hidden     Patient questions:  Do you have a fever, pain , or abdominal swelling? No. Pain Score  0 *  Have you tolerated food without any problems? Yes.    Have you been able to return to your normal activities? Yes.    Do you have any questions about your discharge instructions: Diet   No. Medications  No. Follow up visit  No.  Do you have questions or concerns about your Care? No.  Actions: * If pain score is 4 or above: No action needed, pain <4.  No problems noted per pt. maw

## 2017-01-15 ENCOUNTER — Encounter: Payer: Self-pay | Admitting: Gastroenterology

## 2017-02-12 ENCOUNTER — Encounter: Payer: Self-pay | Admitting: Cardiovascular Disease

## 2017-03-03 ENCOUNTER — Ambulatory Visit: Payer: Self-pay | Admitting: Cardiovascular Disease

## 2017-06-08 NOTE — Progress Notes (Signed)
06/16/2017 Luke Wheeler   1955/10/20  [id number]  Primary Physician Leamon Arnt, MD Primary Cardiologist: [name]    Reason for Visit/CC: Routine F/u for Hypertensive Heart Disease  HPI:  61 y.o.   male from [place] with HTN  echo in 06/2014 demonstrated asymmetrical septal hypertrophy without LVOT gradient, mild diastolic dysfunction, hypertrophic cardiomyopathy could not be ruled out versus LVH from hypertension. He presented to the emergency room in 11/2014 with chest pain. Cardiac enzymes were negative an outpatient stress Myoview was arranged that demonstrated no ischemia and normal LV function. In October 2016, he was ordered to undergo a cardiac MRI to assess cardiac morphology. This showed no morphologic evidence of apical or mid hypertrophic Cardiomyopathy. EF was normal at 63%. His valves were normal.  He was previously on a BB but this was discontinued due to decreased libido. He was changed to verapamil SR 120 mg daily. He has done well with this. He also take Hyzarr for BP and Viagra for ED. He is a chronic smoker with no interest to quit at this time. He is not interested in getting a flu shot.   He is smoking ~6-7 cigarettes a day.  He has been complaint with his medications. BP is well controlled    BP still somewhat elevated discussed issues with nicotine  Current Meds  Medication Sig  . aspirin EC 81 MG EC tablet Take 1 tablet (81 mg total) by mouth daily.  . fluvastatin (LESCOL) 20 MG capsule Take 1 capsule (20 mg total) by mouth daily.  Marland Kitchen losartan-hydrochlorothiazide (HYZAAR) 50-12.5 MG tablet Take 1 tablet by mouth daily.  . sildenafil (VIAGRA) 100 MG tablet Take 1 tablet (100 mg total) by mouth daily as needed for erectile dysfunction.  . verapamil (CALAN-SR) 120 MG CR tablet Take 1 tablet (120 mg total) by mouth at bedtime.   Current Facility-Administered Medications for the 06/16/17 encounter (Office Visit) with [name], MD  Medication    . 0.9 %  sodium chloride infusion   No Known Allergies Past Medical History:  Diagnosis Date  . Cataract   . High cholesterol   . Hx of cardiovascular stress test    ETT-Myoview 4/16:  No ischemia, EF 50%, normal wall motion, normal study  . Hx of echocardiogram    Echo 11/15:  mod asymmetric septal hypertrophy, no LVOT gradient, EF 55%, no RWMA, Gr 1 DD, septal 16 mm, post wall 11.2 mm, no SAM, trivial MR, normal RVF, mild RAE (HCM variant vs LVH)  . Hypertension   . LVH (left ventricular hypertrophy)    non-obstructive HCM vs LVH on echo 06/2014  . Snoring    Family History  Problem Relation Age of Onset  . Diabetes Mother   . Hyperlipidemia Father   . Stroke Father   . Coronary artery disease Father   . Colon cancer Neg Hx    Past Surgical History:  Procedure Laterality Date  . CATARACT EXTRACTION, BILATERAL    . WISDOM TOOTH EXTRACTION     Social History   Social History  . Marital status: Divorced    Spouse name: N/A  . Number of children: N/A  . Years of education: N/A   Occupational History  . Not on file.   Social History Main Topics  . Smoking status: Current Every Day Smoker  . Smokeless tobacco: Never Used  . Alcohol use Yes     Comment: twice monthly  . Drug use: No  . Sexual activity:  Not on file   Other Topics Concern  . Not on file   Social History Narrative  . No narrative on file     Review of Systems: General: negative for chills, fever, night sweats or weight changes.  Cardiovascular: negative for chest pain, dyspnea on exertion, edema, orthopnea, palpitations, paroxysmal nocturnal dyspnea or shortness of breath Dermatological: negative for rash Respiratory: negative for cough or wheezing Urologic: negative for hematuria Abdominal: negative for nausea, vomiting, diarrhea, bright red blood per rectum, melena, or hematemesis Neurologic: negative for visual changes, syncope, or dizziness All other systems reviewed and are otherwise  negative except as noted above.   Physical Exam:  Blood pressure (!) 158/78, pulse 84, height 5\' 11"  (1.803 m), weight 178 lb 12 oz (81.1 kg).  Affect appropriate Healthy:  appears stated age 23: normal Neck supple with no adenopathy JVP normal no bruits no thyromegaly Lungs clear with no wheezing and good diaphragmatic motion Heart:  S1/S2 no murmur, no rub, gallop or click PMI normal Abdomen: benighn, BS positve, no tenderness, no AAA no bruit.  No HSM or HJR Distal pulses intact with no bruits No edema Neuro non-focal Skin warm and dry No muscular weakness   EKG  SR rate 80 biphasic T wave changes inferior lateral leads   ASSESSMENT AND PLAN:   1. Hypertensive heart disease: abnormal ECG concern for HOCM but MRI with no gadolinium uptake   2. Hypertension: increase hyzaar to bid and take calan at night f/u 3 months check    3. Tobacco abuse: Advised smoking cessation however patient is not interested in quitting at this time.  4. Hyperlipidemia: on statin therapy. Lipid profile is followed by his PCP. Patient advised to get upcomming fasting labs faxed to our office for review.   5. Erectile dysfunction: should be provided by primary dose 100 mg increased last visit   6. Other Preventive Care: Patient does a lot of international traveling. He is also a chronic smoker. He was advised to get a flu shot however the patient is refusing a flu shot. He is very skeptical stating that he will get sick if he gets a flu shot.   [name]

## 2017-06-13 ENCOUNTER — Other Ambulatory Visit: Payer: Self-pay | Admitting: Cardiology

## 2017-06-14 ENCOUNTER — Other Ambulatory Visit: Payer: Self-pay

## 2017-06-14 ENCOUNTER — Other Ambulatory Visit: Payer: Self-pay | Admitting: Cardiology

## 2017-06-14 DIAGNOSIS — R9431 Abnormal electrocardiogram [ECG] [EKG]: Secondary | ICD-10-CM

## 2017-06-14 DIAGNOSIS — I421 Obstructive hypertrophic cardiomyopathy: Secondary | ICD-10-CM

## 2017-06-14 MED ORDER — VERAPAMIL HCL ER 120 MG PO TBCR: 120.0000 mg | EXTENDED_RELEASE_TABLET | Freq: Every day | ORAL | 1 refills | Status: DC

## 2017-06-14 MED ORDER — FLUVASTATIN SODIUM 20 MG PO CAPS: 20.0000 mg | ORAL_CAPSULE | Freq: Every day | ORAL | 1 refills | Status: DC

## 2017-06-14 MED ORDER — LOSARTAN POTASSIUM-HCTZ 50-12.5 MG PO TABS: 1.0000 | ORAL_TABLET | Freq: Every day | ORAL | 1 refills | Status: DC

## 2017-06-14 NOTE — Telephone Encounter (Signed)
Pt's pharmacy is requesting a refill on sildenafil 100 mg tablet. Would you like to refill this medication? Please advise.

## 2017-06-15 ENCOUNTER — Other Ambulatory Visit: Payer: Self-pay | Admitting: Cardiology

## 2017-06-16 ENCOUNTER — Encounter (INDEPENDENT_AMBULATORY_CARE_PROVIDER_SITE_OTHER): Payer: Self-pay

## 2017-06-16 ENCOUNTER — Encounter: Payer: Self-pay | Admitting: Cardiovascular Disease

## 2017-06-16 ENCOUNTER — Ambulatory Visit (INDEPENDENT_AMBULATORY_CARE_PROVIDER_SITE_OTHER): Payer: 59 | Admitting: Cardiovascular Disease

## 2017-06-16 VITALS — BP 158/78 | HR 84 | Ht 71.0 in | Wt 178.8 lb

## 2017-06-16 DIAGNOSIS — I200 Unstable angina: Secondary | ICD-10-CM

## 2017-06-16 MED ORDER — LOSARTAN POTASSIUM-HCTZ 50-12.5 MG PO TABS: 1.0000 | ORAL_TABLET | Freq: Two times a day (BID) | ORAL | 6 refills | Status: DC

## 2017-06-16 MED ORDER — VERAPAMIL HCL ER 120 MG PO TBCR: 120.0000 mg | EXTENDED_RELEASE_TABLET | Freq: Every day | ORAL | 6 refills | Status: DC

## 2017-06-16 NOTE — Patient Instructions (Signed)
Medication Instructions:  Your physician has recommended you make the following change in your medication:  1-INCREASE Hyzaar 50/12.5 by mouth twice daily  Labwork: NONE  Testing/Procedures: NONE  Follow-Up: Your physician wants you to follow-up in: December with [name].    If you need a refill on your cardiac medications before your next appointment, please call your pharmacy.

## 2017-06-22 ENCOUNTER — Other Ambulatory Visit (HOSPITAL_COMMUNITY): Payer: Self-pay | Admitting: Internal Medicine

## 2017-06-22 DIAGNOSIS — R7989 Other specified abnormal findings of blood chemistry: Secondary | ICD-10-CM

## 2017-06-23 NOTE — Telephone Encounter (Signed)
Dr Johnsie Cancel outlined in recent office note for PCP to fill Rx from now on.

## 2017-08-19 ENCOUNTER — Encounter (HOSPITAL_COMMUNITY): Payer: 59

## 2017-08-20 ENCOUNTER — Encounter (HOSPITAL_COMMUNITY): Payer: 59

## 2017-08-23 NOTE — Progress Notes (Signed)
08/30/2017 Luke Wheeler   [birth year]  [id number]  Primary Physician Leamon Arnt, MD Primary Cardiologist: [name]    Reason for Visit/CC: Routine F/u for Hypertensive Heart Disease  HPI:  61 y.o.   male from [place] with HTN  echo in 06/2014 demonstrated asymmetrical septal hypertrophy without LVOT gradient, mild diastolic dysfunction, hypertrophic cardiomyopathy could not be ruled out versus LVH from hypertension. He presented to the emergency room in 11/2014 with chest pain. Cardiac enzymes were negative an outpatient stress Myoview was arranged that demonstrated no ischemia and normal LV function. In October 2016, he was ordered to undergo a cardiac MRI to assess cardiac morphology. This showed no morphologic evidence of apical or mid hypertrophic Cardiomyopathy. EF was normal at 63%. His valves were normal.  He was previously on a BB but this was discontinued due to decreased libido..Last visit 06/16/17 Hyzaar increased and verapamil continued with improvement in BP   He is a chronic smoker less than 1/2 ppd  with no interest to quit at this time.      Current Meds  Medication Sig  . amoxicillin-clavulanate (AUGMENTIN) 875-125 MG tablet Take 1 tablet by mouth every 12 (twelve) hours. BEFORE ROCEDURES  . aspirin EC 81 MG EC tablet Take 1 tablet (81 mg total) by mouth daily.  . fluvastatin (LESCOL) 20 MG capsule Take 1 capsule (20 mg total) by mouth daily.  Marland Kitchen levofloxacin (LEVAQUIN) 750 MG tablet Take 1 tablet by mouth daily.  Marland Kitchen losartan-hydrochlorothiazide (HYZAAR) 50-12.5 MG tablet Take 1 tablet by mouth 2 (two) times daily.  . sildenafil (VIAGRA) 100 MG tablet Take 1 tablet (100 mg total) by mouth daily as needed for erectile dysfunction.  . verapamil (CALAN-SR) 120 MG CR tablet Take 1 tablet (120 mg total) by mouth at bedtime.   Current Facility-Administered Medications for the 08/30/17 encounter (Office Visit) with [name], MD  Medication  . 0.9 %  sodium  chloride infusion   No Known Allergies Past Medical History:  Diagnosis Date  . Cataract   . High cholesterol   . Hx of cardiovascular stress test    ETT-Myoview 4/16:  No ischemia, EF 50%, normal wall motion, normal study  . Hx of echocardiogram    Echo 11/15:  mod asymmetric septal hypertrophy, no LVOT gradient, EF 55%, no RWMA, Gr 1 DD, septal 16 mm, post wall 11.2 mm, no SAM, trivial MR, normal RVF, mild RAE (HCM variant vs LVH)  . Hypertension   . LVH (left ventricular hypertrophy)    non-obstructive HCM vs LVH on echo 06/2014  . Snoring    Family History  Problem Relation Age of Onset  . Diabetes Mother   . Hyperlipidemia Father   . Stroke Father   . Coronary artery disease Father   . Colon cancer Neg Hx    Past Surgical History:  Procedure Laterality Date  . CATARACT EXTRACTION, BILATERAL    . WISDOM TOOTH EXTRACTION     Social History   Socioeconomic History  . Marital status: Divorced    Spouse name: Not on file  . Number of children: Not on file  . Years of education: Not on file  . Highest education level: Not on file  Social Needs  . Financial resource strain: Not on file  . Food insecurity - worry: Not on file  . Food insecurity - inability: Not on file  . Transportation needs - medical: Not on file  . Transportation needs - non-medical: Not on file  Occupational History  . Not on file  Tobacco Use  . Smoking status: Current Every Day Smoker  . Smokeless tobacco: Never Used  Substance and Sexual Activity  . Alcohol use: Yes    Comment: twice monthly  . Drug use: No  . Sexual activity: Not on file  Other Topics Concern  . Not on file  Social History Narrative  . Not on file     Review of Systems: General: negative for chills, fever, night sweats or weight changes.  Cardiovascular: negative for chest pain, dyspnea on exertion, edema, orthopnea, palpitations, paroxysmal nocturnal dyspnea or shortness of breath Dermatological: negative for  rash Respiratory: negative for cough or wheezing Urologic: negative for hematuria Abdominal: negative for nausea, vomiting, diarrhea, bright red blood per rectum, melena, or hematemesis Neurologic: negative for visual changes, syncope, or dizziness All other systems reviewed and are otherwise negative except as noted above.   Physical Exam:  Blood pressure 114/70, pulse 74, height 5\' 11"  (1.803 m), weight 178 lb 4 oz (80.9 kg), SpO2 98 %.  Affect appropriate Healthy:  appears stated age 63: normal Neck supple with no adenopathy JVP normal no bruits no thyromegaly Lungs clear with no wheezing and good diaphragmatic motion Heart:  S1/S2 no murmur, no rub, gallop or click PMI normal Abdomen: benighn, BS positve, no tenderness, no AAA no bruit.  No HSM or HJR Distal pulses intact with no bruits No edema Neuro non-focal Skin warm and dry No muscular weakness    EKG  SR rate 80 biphasic T wave changes inferior lateral leads   ASSESSMENT AND PLAN:   1. Hypertensive heart disease: abnormal ECG concern for HOCM but MRI with no gadolinium uptake    2. Hypertension:  Well controlled.  Continue current medications and low sodium Dash type diet.    3. Tobacco abuse: Advised smoking cessation however patient is not interested in quitting at this time. CXR ordered   4. Hyperlipidemia: on statin therapy. Lipid profile is followed by his PCP. Patient advised to get upcomming fasting labs faxed to our office for review.   5. Erectile dysfunction: should be provided by primary dose 100 mg increased last visit     [name]

## 2017-08-30 ENCOUNTER — Ambulatory Visit
Admission: RE | Admit: 2017-08-30 | Discharge: 2017-08-30 | Disposition: A | Discharge status: Home / Self Care | Payer: 59 | Source: Ambulatory Visit | Attending: Cardiovascular Disease | Admitting: Cardiovascular Disease

## 2017-08-30 ENCOUNTER — Ambulatory Visit: Payer: 59 | Admitting: Cardiovascular Disease

## 2017-08-30 ENCOUNTER — Encounter: Payer: Self-pay | Admitting: Cardiovascular Disease

## 2017-08-30 ENCOUNTER — Encounter (INDEPENDENT_AMBULATORY_CARE_PROVIDER_SITE_OTHER): Payer: Self-pay

## 2017-08-30 VITALS — BP 114/70 | HR 74 | Ht 71.0 in | Wt 178.2 lb

## 2017-08-30 DIAGNOSIS — F17210 Nicotine dependence, cigarettes, uncomplicated: Secondary | ICD-10-CM

## 2017-08-30 NOTE — Patient Instructions (Addendum)
Medication Instructions:  Your physician recommends that you continue on your current medications as directed. Please refer to the Current Medication list given to you today.  Labwork: NONE  Testing/Procedures: A chest x-ray takes a picture of the organs and structures inside the chest, including the heart, lungs, and blood vessels. This test can show several things, including, whether the heart is enlarges; whether fluid is building up in the lungs; and whether pacemaker / defibrillator leads are still in place.   Follow-Up: Your physician wants you to follow-up in: 12 months with Dr. Nishan. You will receive a reminder letter in the mail two months in advance. If you don't receive a letter, please call our office to schedule the follow-up appointment.   If you need a refill on your cardiac medications before your next appointment, please call your pharmacy.    

## 2017-08-31 ENCOUNTER — Telehealth: Payer: Self-pay | Admitting: Cardiovascular Disease

## 2017-08-31 NOTE — Telephone Encounter (Signed)
F/ U patient returning call for DG Chest results

## 2017-08-31 NOTE — Telephone Encounter (Signed)
Patient aware of chest xray results.

## 2017-10-17 IMAGING — MR MR CARD MORPHOLOGY WO/W CM
9 of 11 series · 14 of 16 positions shown · IV contrast (25    Multihance)
Comparison: none

CLINICAL DATA: R/O Hypertrophic Cardiomyopathy

EXAM:
CARDIAC MRI
TECHNIQUE: The patient was scanned on a 1.5 Tesla GE magnet. A dedicated
cardiac coil was used. Functional imaging was done using Fiesta
sequences. [DATE], and 4 chamber views were done to assess for RWMA's.
Modified Ronald rule using a short axis stack was used to
calculate an ejection fraction on a dedicated work station using
Circle software. The patient received 28 cc of Multihance. After 10
minutes inversion recovery sequences were used to assess for
infiltration and scar tissue.
CONTRAST:  28 cc Multihance

[Series 3: bSSFP · sagittal · 8.0mm · 1.33mm/px · 1 of 14 slices shown (1 of 4)]
[im 1/14]
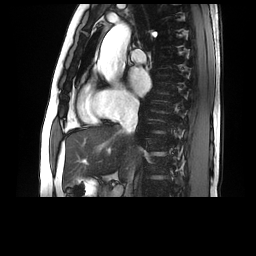

[Series 5: bSSFP · axial · 8.0mm · 1.25mm/px · 1 of 20 slices shown (2 of 4)]
[im 1/20]
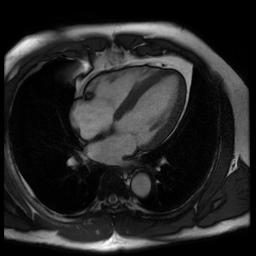

[Series 7: bSSFP · oblique · 8.0mm · 1.41mm/px · 6 of 380 slices shown (3 of 4)]
[im 1/380]
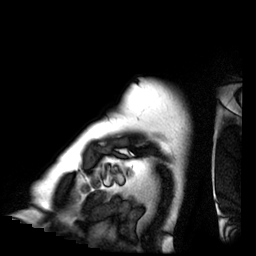
[im 76/380]
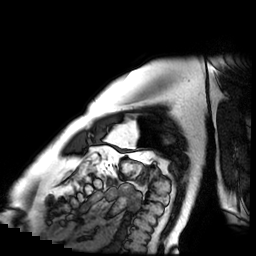
[im 152/380]
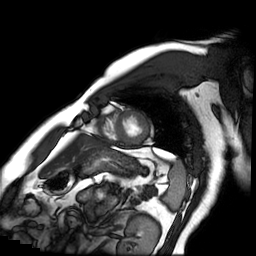
[im 228/380]
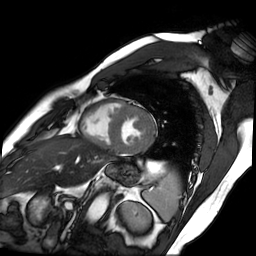
[im 304/380]
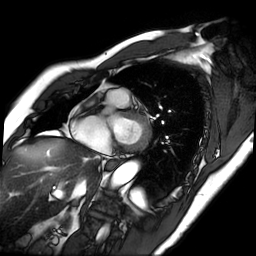
[im 380/380]
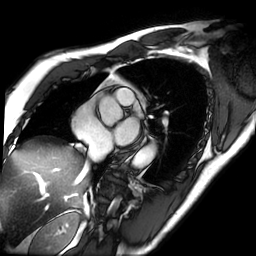

[Series 8: bSSFP · oblique · 8.0mm · 1.41mm/px · 1 of 60 slices shown (4 of 4)]
[im 1/60]
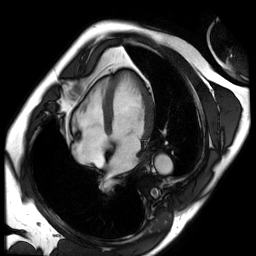

[Series 9: cine ir · oblique · 8.0mm · 1.45mm/px · 1 of 30 slices shown]
[im 1/30]
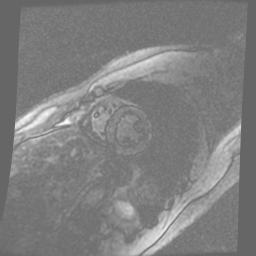

[Series 13: delayed ir prep · oblique · 8.0mm · 1.41mm/px · 1 of 7 slices shown (1 of 3)]
[im 1/7]
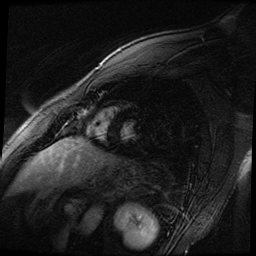

[Series 14: delayed ir prep · oblique · 8.0mm · 1.41mm/px · 1 of 5 slices shown (2 of 3)]
[im 1/5]
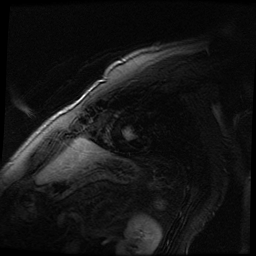

[Series 15: delayed ir prep · oblique · 8.0mm · 1.41mm/px · 1 of 8 slices shown (3 of 3)]
[im 1/8]
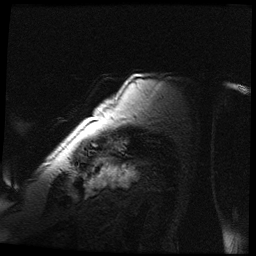

[Series 16: rad delayed ir · oblique · 8.0mm · 1.41mm/px · 1 of 3 slices shown]
[im 1/3]
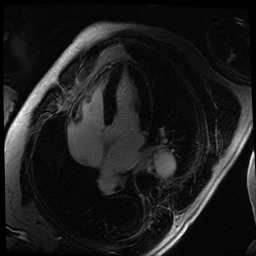

[14 of 16 positions shown; findings below may reference images not displayed]

FINDINGS: All 4 cardiac chambers were normal in size and function. No ASD/VSD
No pericardial effusion. Mild to moderate septal hypertrophy 13mm
Posterior wall thickness 11 mm. No morphologic evidence of mid or
apical hypertrophy. No mid/apical cavity obliteration in systole.
Normal aortic, mitral and tricuspid valves. Quantitative EF 63% (EDV
110 cc ESV 41 cc SV 69 cc) Delayed inversion recovery sequences with
gadolinium showed no hyperenhancement and only some apical thinning.
IMPRESSION: 1) No morphologic evidence of apical or mid hypertrophic
cardiomyopathy

2) EF 63% no RWMA;s

3) Normal valves

4) Normal RV

5) No delayed hyperenhancement post gadolinium

Adrian Adek Rams

## 2018-03-10 ENCOUNTER — Telehealth: Payer: Self-pay

## 2018-03-10 NOTE — Telephone Encounter (Signed)
SENT REFERRAL TO SCHEDULING AND FILED NOTES 

## 2018-03-11 NOTE — Progress Notes (Signed)
03/18/2018 Luke Wheeler   Mar 22, 1956  [id number]  Primary Physician Long, Luke Reaper, [name] Primary Cardiologist: Dr. Johnsie Wheeler    Reason for Visit/CC: Routine F/u for Hypertensive Heart Disease  HPI: 62 y.o. HTN and asymmetric septal hypertrophy No evidence of HOCM by cardiac MRI October 2016 Atypical chest pain April 2016 with normal myovue EF 63% Does not like beta blocker for HTN due to erectile dysfunction Still smoking 1/2 ppd Takes Lescol for HLD labs followed by primary   Primary concerned about murmur but I have not heard any significant murmur Today or in past and he had no significant valve disease on his echo or MRI  Two boys age 67/12 recent trip to [place] was fun   Current Meds  Medication Sig  . amoxicillin-clavulanate (AUGMENTIN) 875-125 MG tablet Take 1 tablet by mouth every 12 (twelve) hours. BEFORE ROCEDURES  . aspirin EC 81 MG EC tablet Take 1 tablet (81 mg total) by mouth daily.  . fluvastatin (LESCOL) 20 MG capsule Take 1 capsule (20 mg total) by mouth daily.  Marland Kitchen losartan-hydrochlorothiazide (HYZAAR) 50-12.5 MG tablet Take 1 tablet by mouth 2 (two) times daily.  . sildenafil (VIAGRA) 100 MG tablet Take 1 tablet (100 mg total) by mouth daily as needed for erectile dysfunction.  . verapamil (CALAN-SR) 120 MG CR tablet Take 1 tablet (120 mg total) by mouth at bedtime.   Current Facility-Administered Medications for the 03/18/18 encounter (Office Visit) with Luke Hector, MD  Medication  . 0.9 %  sodium chloride infusion   No Known Allergies Past Medical History:  Diagnosis Date  . Cataract   . High cholesterol   . Hx of cardiovascular stress test    ETT-Myoview 4/16:  No ischemia, EF 50%, normal wall motion, normal study  . Hx of echocardiogram    Echo 11/15:  mod asymmetric septal hypertrophy, no LVOT gradient, EF 55%, no RWMA, Gr 1 DD, septal 16 mm, post wall 11.2 mm, no SAM, trivial MR, normal RVF, mild RAE (HCM variant vs LVH)  . Hypertension   .  LVH (left ventricular hypertrophy)    non-obstructive HCM vs LVH on echo 06/2014  . Snoring    Family History  Problem Relation Age of Onset  . Diabetes Mother   . Hyperlipidemia Father   . Stroke Father   . Coronary artery disease Father   . Colon cancer Neg Hx    Past Surgical History:  Procedure Laterality Date  . CATARACT EXTRACTION, BILATERAL    . WISDOM TOOTH EXTRACTION     Social History   Socioeconomic History  . Marital status: Divorced    Spouse name: Not on file  . Number of children: Not on file  . Years of education: Not on file  . Highest education level: Not on file  Occupational History  . Not on file  Social Needs  . Financial resource strain: Not on file  . Food insecurity:    Worry: Not on file    Inability: Not on file  . Transportation needs:    Medical: Not on file    Non-medical: Not on file  Tobacco Use  . Smoking status: Current Every Day Smoker  . Smokeless tobacco: Never Used  Substance and Sexual Activity  . Alcohol use: Yes    Comment: twice monthly  . Drug use: No  . Sexual activity: Not on file  Lifestyle  . Physical activity:    Days per week: Not on file    Minutes per  session: Not on file  . Stress: Not on file  Relationships  . Social connections:    Talks on phone: Not on file    Gets together: Not on file    Attends religious service: Not on file    Active member of club or organization: Not on file    Attends meetings of clubs or organizations: Not on file    Relationship status: Not on file  . Intimate partner violence:    Fear of current or ex partner: Not on file    Emotionally abused: Not on file    Physically abused: Not on file    Forced sexual activity: Not on file  Other Topics Concern  . Not on file  Social History Narrative  . Not on file     Review of Systems: General: negative for chills, fever, night sweats or weight changes.  Cardiovascular: negative for chest pain, dyspnea on exertion, edema,  orthopnea, palpitations, paroxysmal nocturnal dyspnea or shortness of breath Dermatological: negative for rash Respiratory: negative for cough or wheezing Urologic: negative for hematuria Abdominal: negative for nausea, vomiting, diarrhea, bright red blood per rectum, melena, or hematemesis Neurologic: negative for visual changes, syncope, or dizziness All other systems reviewed and are otherwise negative except as noted above.   Physical Exam:  Blood pressure 130/88, pulse 87, height 5\' 11"  (1.803 m), weight 190 lb 6.4 oz (86.4 kg), SpO2 98 %.  Affect appropriate Healthy:  appears stated age 62: normal Neck supple with no adenopathy JVP normal no bruits no thyromegaly Lungs clear with no wheezing and good diaphragmatic motion Heart:  S1/S2 no murmur, no rub, gallop or click PMI normal Abdomen: benighn, BS positve, no tenderness, no AAA no bruit.  No HSM or HJR Distal pulses intact with no bruits No edema Neuro non-focal Skin warm and dry No muscular weakness     EKG   06/16/17 SR rate 80 biphasic T wave changes inferior lateral leads   ASSESSMENT AND PLAN:   1. Hypertensive heart disease: abnormal ECG concern for HOCM but MRI with no gadolinium uptake    2. Hypertension:  Well controlled.  Continue current medications and low sodium Dash type diet.    3. Tobacco abuse: Advised smoking cessation however patient is not interested in quitting at this time. CXR 08/2017 NAD   4. Hyperlipidemia: on statin therapy. Lipid profile is followed by his PCP. Patient advised to get upcomming fasting labs faxed to our office for review.   5. Erectile dysfunction: should be provided by primary dose 100 mg tab usual dose     Luke Wheeler

## 2018-03-14 ENCOUNTER — Ambulatory Visit: Payer: Self-pay | Admitting: Cardiovascular Disease

## 2018-03-18 ENCOUNTER — Ambulatory Visit: Payer: 59 | Admitting: Cardiovascular Disease

## 2018-03-18 ENCOUNTER — Encounter (INDEPENDENT_AMBULATORY_CARE_PROVIDER_SITE_OTHER): Payer: Self-pay

## 2018-03-18 ENCOUNTER — Encounter: Payer: Self-pay | Admitting: Cardiovascular Disease

## 2018-03-18 VITALS — BP 130/88 | HR 87 | Ht 71.0 in | Wt 190.4 lb

## 2018-03-18 DIAGNOSIS — I10 Essential (primary) hypertension: Secondary | ICD-10-CM

## 2018-03-18 NOTE — Patient Instructions (Signed)

## 2018-03-30 ENCOUNTER — Other Ambulatory Visit: Payer: Self-pay | Admitting: Urology

## 2018-03-30 DIAGNOSIS — R972 Elevated prostate specific antigen [PSA]: Secondary | ICD-10-CM

## 2018-05-09 ENCOUNTER — Ambulatory Visit
Admission: RE | Admit: 2018-05-09 | Discharge: 2018-05-09 | Disposition: A | Discharge status: Home / Self Care | Payer: 59 | Source: Ambulatory Visit | Attending: Urology | Admitting: Urology

## 2018-05-09 DIAGNOSIS — R972 Elevated prostate specific antigen [PSA]: Secondary | ICD-10-CM

## 2018-05-09 MED ORDER — GADOBENATE DIMEGLUMINE 529 MG/ML IV SOLN
18.0000 mL | Freq: Once | INTRAVENOUS | Status: AC | PRN
  Administered 2018-05-09: 18 mL via INTRAVENOUS

## 2018-05-11 ENCOUNTER — Other Ambulatory Visit: Payer: Self-pay

## 2019-05-03 NOTE — Progress Notes (Signed)
05/12/2019 Luke Wheeler   1956-02-18  [id number]  Primary Physician [name], Luke Reaper, [name] Primary Cardiologist: [name]    Reason for Visit/CC: Routine F/u for Hypertensive Heart Disease  HPI: 63 y.o. HTN and asymmetric septal hypertrophy No evidence of HOCM by cardiac MRI October 2016 Atypical chest pain April 2016 with normal myovue EF 63% Does not like beta blocker for HTN due to erectile dysfunction Still smoking 1/2 ppd Takes Lescol for HLD labs followed by primary   Primary concerned about murmur but I have not heard any significant murmur Today or in past and he had no significant valve disease on his echo or MRI  Two boys age 27/13   They are at day school Wife in [place] works for division of [employer]  Current Meds  Medication Sig  . amoxicillin-clavulanate (AUGMENTIN) 875-125 MG tablet Take 1 tablet by mouth every 12 (twelve) hours. BEFORE ROCEDURES  . aspirin EC 81 MG EC tablet Take 1 tablet (81 mg total) by mouth daily.  . fluvastatin (LESCOL) 20 MG capsule Take 1 capsule (20 mg total) by mouth daily.  Marland Kitchen losartan-hydrochlorothiazide (HYZAAR) 50-12.5 MG tablet Take 1 tablet by mouth 2 (two) times daily.  . sildenafil (VIAGRA) 100 MG tablet Take 1 tablet (100 mg total) by mouth daily as needed for erectile dysfunction.  . verapamil (CALAN-SR) 120 MG CR tablet Take 1 tablet (120 mg total) by mouth at bedtime.   Current Facility-Administered Medications for the 05/12/19 encounter (Office Visit) with Luke Hector, MD  Medication  . 0.9 %  sodium chloride infusion   No Known Allergies Past Medical History:  Diagnosis Date  . Cataract   . High cholesterol   . Hx of cardiovascular stress test    ETT-Myoview 4/16:  No ischemia, EF 50%, normal wall motion, normal study  . Hx of echocardiogram    Echo 11/15:  mod asymmetric septal hypertrophy, no LVOT gradient, EF 55%, no RWMA, Gr 1 DD, septal 16 mm, post wall 11.2 mm, no SAM, trivial MR, normal RVF, mild RAE (HCM  variant vs LVH)  . Hypertension   . LVH (left ventricular hypertrophy)    non-obstructive HCM vs LVH on echo 06/2014  . Snoring    Family History  Problem Relation Age of Onset  . Diabetes Mother   . Hyperlipidemia Father   . Stroke Father   . Coronary artery disease Father   . Colon cancer Neg Hx    Past Surgical History:  Procedure Laterality Date  . CATARACT EXTRACTION, BILATERAL    . WISDOM TOOTH EXTRACTION     Social History   Socioeconomic History  . Marital status: Divorced    Spouse name: Not on file  . Number of children: Not on file  . Years of education: Not on file  . Highest education level: Not on file  Occupational History  . Not on file  Social Needs  . Financial resource strain: Not on file  . Food insecurity    Worry: Not on file    Inability: Not on file  . Transportation needs    Medical: Not on file    Non-medical: Not on file  Tobacco Use  . Smoking status: Current Every Day Smoker  . Smokeless tobacco: Never Used  Substance and Sexual Activity  . Alcohol use: Yes    Comment: twice monthly  . Drug use: No  . Sexual activity: Not on file  Lifestyle  . Physical activity    Days per week:  Not on file    Minutes per session: Not on file  . Stress: Not on file  Relationships  . Social Herbalist on phone: Not on file    Gets together: Not on file    Attends religious service: Not on file    Active member of club or organization: Not on file    Attends meetings of clubs or organizations: Not on file    Relationship status: Not on file  . Intimate partner violence    Fear of current or ex partner: Not on file    Emotionally abused: Not on file    Physically abused: Not on file    Forced sexual activity: Not on file  Other Topics Concern  . Not on file  Social History Narrative  . Not on file     Review of Systems: General: negative for chills, fever, night sweats or weight changes.  Cardiovascular: negative for chest  pain, dyspnea on exertion, edema, orthopnea, palpitations, paroxysmal nocturnal dyspnea or shortness of breath Dermatological: negative for rash Respiratory: negative for cough or wheezing Urologic: negative for hematuria Abdominal: negative for nausea, vomiting, diarrhea, bright red blood per rectum, melena, or hematemesis Neurologic: negative for visual changes, syncope, or dizziness All other systems reviewed and are otherwise negative except as noted above.   Physical Exam:  Blood pressure 110/72, pulse 61, height 5\' 11"  (1.803 m), weight 188 lb (85.3 kg), SpO2 97 %.  Affect appropriate Healthy:  appears stated age 52: normal Neck supple with no adenopathy JVP normal no bruits no thyromegaly Lungs clear with no wheezing and good diaphragmatic motion Heart:  S1/S2 no murmur, no rub, gallop or click PMI normal Abdomen: benighn, BS positve, no tenderness, no AAA no bruit.  No HSM or HJR Distal pulses intact with no bruits No edema Neuro non-focal Skin warm and dry No muscular weakness     EKG   06/16/17 SR rate 80 biphasic T wave changes inferior lateral leads   ASSESSMENT AND PLAN:   1. Hypertensive heart disease: abnormal ECG concern for HOCM but MRI with no gadolinium uptake in 2016 will repeat now BMET today for gadolinium use    2. Hypertension:  Well controlled.  Continue current medications and low sodium Dash type diet.    3. Tobacco abuse: Advised smoking cessation however patient is not interested in quitting at this time. CXR 08/2017 NAD   4. Hyperlipidemia: on statin therapy. Lipid profile is followed by his PCP. Patient advised to get upcomming fasting labs faxed to our office for review.   5. Erectile dysfunction: should be provided by primary Viagra dose 100 mg tab usual dose   6. Abnormal ECG: inferolateral biphasic T waves likely related to HTN/LVH   [employer]

## 2019-05-12 ENCOUNTER — Ambulatory Visit (INDEPENDENT_AMBULATORY_CARE_PROVIDER_SITE_OTHER): Payer: 59 | Admitting: Cardiovascular Disease

## 2019-05-12 ENCOUNTER — Other Ambulatory Visit: Payer: Self-pay

## 2019-05-12 ENCOUNTER — Encounter: Payer: Self-pay | Admitting: Cardiovascular Disease

## 2019-05-12 VITALS — BP 110/72 | HR 61 | Ht 71.0 in | Wt 188.0 lb

## 2019-05-12 DIAGNOSIS — I429 Cardiomyopathy, unspecified: Secondary | ICD-10-CM | POA: Diagnosis not present

## 2019-05-12 DIAGNOSIS — Z01812 Encounter for preprocedural laboratory examination: Secondary | ICD-10-CM

## 2019-05-12 DIAGNOSIS — R9431 Abnormal electrocardiogram [ECG] [EKG]: Secondary | ICD-10-CM

## 2019-05-12 LAB — BASIC METABOLIC PANEL
BUN/Creatinine Ratio: 9 — ABNORMAL LOW (ref 10–24)
BUN: 13 mg/dL (ref 8–27)
CO2: 26 mmol/L (ref 20–29)
Calcium: 10.9 mg/dL — ABNORMAL HIGH (ref 8.6–10.2)
Chloride: 98 mmol/L (ref 96–106)
Creatinine, Ser: 1.4 mg/dL — ABNORMAL HIGH (ref 0.76–1.27)
GFR calc Af Amer: 61 mL/min/{1.73_m2} (ref >59)
GFR calc non Af Amer: 53 mL/min/{1.73_m2} — ABNORMAL LOW (ref >59)
Glucose: 89 mg/dL (ref 65–99)
Potassium: 4.6 mmol/L (ref 3.5–5.2)
Sodium: 140 mmol/L (ref 134–144)

## 2019-05-12 NOTE — Patient Instructions (Addendum)
Medication Instructions:   If you need a refill on your cardiac medications before your next appointment, please call your pharmacy.   Lab work: Your physician recommends that you have lab work today. BMET  If you have labs (blood work) drawn today and your tests are completely normal, you will receive your results only by: Marland Kitchen MyChart Message (if you have MyChart) OR . A paper copy in the mail If you have any lab test that is abnormal or we need to change your treatment, we will call you to review the results.  Testing/Procedures: Your physician has requested that you have a cardiac MRI. Cardiac MRI uses a computer to create images of your heart as its beating, producing both still and moving pictures of your heart and major blood vessels. For further information please visit [email or web address] Please follow the instruction sheet given to you today for more information.  Follow-Up: At [facility], you and your health needs are our priority.  As part of our continuing mission to provide you with exceptional heart care, we have created designated Provider Care Teams.  These Care Teams include your primary Cardiologist (physician) and Advanced Practice Providers (APPs -  Physician Assistants and Nurse Practitioners) who all work together to provide you with the care you need, when you need it. You will need a follow up appointment in 12 months.  Please call our office 2 months in advance to schedule this appointment.  You may see [name], MD or one of the following Advanced Practice Providers on your designated Care Team:   Truitt Merle, NP Cecilie Kicks, NP . Kathyrn Drown, NP

## 2019-05-17 ENCOUNTER — Telehealth: Payer: Self-pay | Admitting: Cardiovascular Disease

## 2019-05-17 ENCOUNTER — Encounter: Payer: Self-pay | Admitting: Cardiovascular Disease

## 2019-05-17 NOTE — Telephone Encounter (Signed)
Left message regarding appointment for Cardiac MRI scheduled for Friday 06/02/19 at 9:00am.  Arrival time is 8:15 am at the 1st floor admitting office at [facility].  Will also mail information to patient

## 2019-06-01 ENCOUNTER — Telehealth (HOSPITAL_COMMUNITY): Payer: Self-pay | Admitting: Emergency Medicine

## 2019-06-01 NOTE — Telephone Encounter (Signed)
Reaching out to patient to offer assistance regarding upcoming cardiac imaging study; pt verbalizes understanding of appt date/time, parking situation and where to check in, and verified current allergies; name and call back number provided for further questions should they arise Lateef Juncaj RN Navigator Cardiac Imaging [facility] [phone] office [phone] cell 

## 2019-06-02 ENCOUNTER — Other Ambulatory Visit: Payer: Self-pay

## 2019-06-02 ENCOUNTER — Ambulatory Visit (HOSPITAL_COMMUNITY)
Admission: RE | Admit: 2019-06-02 | Discharge: 2019-06-02 | Disposition: A | Discharge status: Home / Self Care | Payer: 59 | Source: Ambulatory Visit | Attending: Cardiovascular Disease | Admitting: Cardiovascular Disease

## 2019-06-02 DIAGNOSIS — R9431 Abnormal electrocardiogram [ECG] [EKG]: Secondary | ICD-10-CM

## 2019-06-02 DIAGNOSIS — I429 Cardiomyopathy, unspecified: Secondary | ICD-10-CM | POA: Insufficient documentation

## 2019-06-02 MED ORDER — GADOBUTROL 1 MMOL/ML IV SOLN
10.0000 mL | Freq: Once | INTRAVENOUS | Status: AC | PRN
  Administered 2019-06-02: 10 mL via INTRAVENOUS

## 2019-07-04 NOTE — Progress Notes (Deleted)
07/04/2019 Luke Wheeler   December 20, 1955  [id number]  Primary Physician Long, [name], [name] Primary Cardiologist: [name]    Reason for Visit/CC: Routine F/u for Hypertensive Heart Disease  HPI: 63 y.o. HTN and asymmetric septal hypertrophy No evidence of HOCM by cardiac MRI October 2016 Atypical chest pain April 2016 with normal myovue EF 63% Does not like beta blocker for HTN due to erectile dysfunction Still smoking 1/2 ppd Takes Lescol for HLD labs followed by primary   Primary concerned about murmur but I have not heard any significant murmur Today or in past and he had no significant valve disease on his echo or MRI  Two boys age 34/13   They are at day school Wife in [place] works for division of [employer]  MRI: reviewed 06/02/19 EF 56% septal thickness only 14 mm no SAM no gad uptake in LV myocardium  No change from MRI done October 2016   No outpatient medications have been marked as taking for the 07/05/19 encounter (Appointment) with [name], MD.   Current Facility-Administered Medications for the 07/05/19 encounter (Appointment) with [name], MD  Medication  . 0.9 %  sodium chloride infusion   No Known Allergies Past Medical History:  Diagnosis Date  . Cataract   . High cholesterol   . Hx of cardiovascular stress test    ETT-Myoview 4/16:  No ischemia, EF 50%, normal wall motion, normal study  . Hx of echocardiogram    Echo 11/15:  mod asymmetric septal hypertrophy, no LVOT gradient, EF 55%, no RWMA, Gr 1 DD, septal 16 mm, post wall 11.2 mm, no SAM, trivial MR, normal RVF, mild RAE (HCM variant vs LVH)  . Hypertension   . LVH (left ventricular hypertrophy)    non-obstructive HCM vs LVH on echo 06/2014  . Snoring    Family History  Problem Relation Age of Onset  . Diabetes Mother   . Hyperlipidemia Father   . Stroke Father   . Coronary artery disease Father   . Colon cancer Neg Hx    Past Surgical History:  Procedure Laterality Date  .  CATARACT EXTRACTION, BILATERAL    . WISDOM TOOTH EXTRACTION     Social History   Socioeconomic History  . Marital status: Divorced    Spouse name: Not on file  . Number of children: Not on file  . Years of education: Not on file  . Highest education level: Not on file  Occupational History  . Not on file  Social Needs  . Financial resource strain: Not on file  . Food insecurity    Worry: Not on file    Inability: Not on file  . Transportation needs    Medical: Not on file    Non-medical: Not on file  Tobacco Use  . Smoking status: Current Every Day Smoker  . Smokeless tobacco: Never Used  Substance and Sexual Activity  . Alcohol use: Yes    Comment: twice monthly  . Drug use: No  . Sexual activity: Not on file  Lifestyle  . Physical activity    Days per week: Not on file    Minutes per session: Not on file  . Stress: Not on file  Relationships  . Social Herbalist on phone: Not on file    Gets together: Not on file    Attends religious service: Not on file    Active member of club or organization: Not on file    Attends  meetings of clubs or organizations: Not on file    Relationship status: Not on file  . Intimate partner violence    Fear of current or ex partner: Not on file    Emotionally abused: Not on file    Physically abused: Not on file    Forced sexual activity: Not on file  Other Topics Concern  . Not on file  Social History Narrative  . Not on file     Review of Systems: General: negative for chills, fever, night sweats or weight changes.  Cardiovascular: negative for chest pain, dyspnea on exertion, edema, orthopnea, palpitations, paroxysmal nocturnal dyspnea or shortness of breath Dermatological: negative for rash Respiratory: negative for cough or wheezing Urologic: negative for hematuria Abdominal: negative for nausea, vomiting, diarrhea, bright red blood per rectum, melena, or hematemesis Neurologic: negative for visual changes,  syncope, or dizziness All other systems reviewed and are otherwise negative except as noted above.   Physical Exam:  There were no vitals taken for this visit.  Affect appropriate Healthy:  appears stated age 49: normal Neck supple with no adenopathy JVP normal no bruits no thyromegaly Lungs clear with no wheezing and good diaphragmatic motion Heart:  S1/S2 no murmur, no rub, gallop or click PMI normal Abdomen: benighn, BS positve, no tenderness, no AAA no bruit.  No HSM or HJR Distal pulses intact with no bruits No edema Neuro non-focal Skin warm and dry No muscular weakness     EKG   06/16/17 SR rate 80 biphasic T wave changes inferior lateral leads   ASSESSMENT AND PLAN:   1. Hypertensive heart disease: abnormal ECG concern for HOCM but MRI 06/02/19 no LV myocardial gad uptake And septal thickness 14 mm no SAM    2. Hypertension:  Well controlled.  Continue current medications and low sodium Dash type diet.    3. Tobacco abuse: Advised smoking cessation however patient is not interested in quitting at this time. CXR 08/2017 NAD   4. Hyperlipidemia: on statin therapy. Lipid profile is followed by his PCP. Patient advised to get upcomming fasting labs faxed to our office for review.   5. Erectile dysfunction: should be provided by primary Viagra dose 100 mg tab usual dose   6. Abnormal ECG: inferolateral biphasic T waves likely related to HTN/LVH   [employer]

## 2019-07-05 ENCOUNTER — Ambulatory Visit: Payer: 59 | Admitting: Cardiology

## 2019-07-05 ENCOUNTER — Ambulatory Visit: Payer: 59 | Admitting: Cardiovascular Disease

## 2019-08-24 ENCOUNTER — Encounter

## 2020-04-05 ENCOUNTER — Encounter: Payer: Self-pay | Admitting: Gastroenterology

## 2020-06-03 NOTE — Progress Notes (Signed)
06/13/2020 Luke Wheeler   Feb 28, 1956  [id number]  Primary Physician Long, [name], [name] Primary Cardiologist: [name]    HPI: 64 y.o. HTN and asymmetric septal hypertrophy No evidence of HOCM by cardiac MRI October 2016 and 06/02/19 with septal thickness 14 mm only LVEF 56%  Atypical chest pain April 2016 with normal myovue EF 63% Does not like beta blocker for HTN due to erectile dysfunction Still smoking 1/2 ppd Takes Lescol for HLD labs followed by primary   Two boys age 46/14   They are at day school Wife in [place] works for division of [employer]  He retired 3 months ago Relaxing some left calf strain playing tennis   Current Meds  Medication Sig   aspirin EC 81 MG EC tablet Take 1 tablet (81 mg total) by mouth daily.   fluvastatin (LESCOL) 20 MG capsule Take 1 capsule (20 mg total) by mouth daily.   losartan (COZAAR) 100 MG tablet Take 1 tablet by mouth daily.   sildenafil (VIAGRA) 100 MG tablet Take 1 tablet (100 mg total) by mouth daily as needed for erectile dysfunction.   verapamil (CALAN-SR) 120 MG CR tablet Take 1 tablet (120 mg total) by mouth at bedtime.   Current Facility-Administered Medications for the 06/13/20 encounter (Office Visit) with [name], MD  Medication   0.9 %  sodium chloride infusion   No Known Allergies Past Medical History:  Diagnosis Date   Cataract    High cholesterol    Hx of cardiovascular stress test    ETT-Myoview 4/16:  No ischemia, EF 50%, normal wall motion, normal study   Hx of echocardiogram    Echo 11/15:  mod asymmetric septal hypertrophy, no LVOT gradient, EF 55%, no RWMA, Gr 1 DD, septal 16 mm, post wall 11.2 mm, no SAM, trivial MR, normal RVF, mild RAE (HCM variant vs LVH)   Hypertension    LVH (left ventricular hypertrophy)    non-obstructive HCM vs LVH on echo 06/2014   Snoring    Family History  Problem Relation Age of Onset   Diabetes Mother    Hyperlipidemia Father    Stroke Father     Coronary artery disease Father    Colon cancer Neg Hx    Past Surgical History:  Procedure Laterality Date   CATARACT EXTRACTION, BILATERAL     WISDOM TOOTH EXTRACTION     Social History   Socioeconomic History   Marital status: Married    Spouse name: Not on file   Number of children: 4   Years of education: Not on file   Highest education level: Not on file  Occupational History   Not on file  Tobacco Use   Smoking status: Current Every Day Smoker   Smokeless tobacco: Never Used  Vaping Use   Vaping Use: Never used  Substance and Sexual Activity   Alcohol use: Yes    Comment: twice monthly   Drug use: No   Sexual activity: Yes  Other Topics Concern   Not on file  Social History Narrative   Not on file   Social Determinants of Health   Financial Resource Strain:    Difficulty of Paying Living Expenses: Not on file  Food Insecurity:    Worried About Estate manager/land agent of Food in the Last Year: Not on file   [employer] of Food in the Last Year: Not on file  Transportation Needs:    Lack of Transportation (Medical): Not on file   Lack of  Transportation (Non-Medical): Not on file  Physical Activity:    Days of Exercise per Week: Not on file   Minutes of Exercise per Session: Not on file  Stress:    Feeling of Stress : Not on file  Social Connections:    Frequency of Communication with Friends and Family: Not on file   Frequency of Social Gatherings with Friends and Family: Not on file   Attends Religious Services: Not on file   Active Member of Clubs or Organizations: Not on file   Attends Archivist Meetings: Not on file   Marital Status: Not on file  Intimate Partner Violence:    Fear of Current or Ex-Partner: Not on file   Emotionally Abused: Not on file   Physically Abused: Not on file   Sexually Abused: Not on file     Review of Systems: General: negative for chills, fever, night sweats or weight changes.    Cardiovascular: negative for chest pain, dyspnea on exertion, edema, orthopnea, palpitations, paroxysmal nocturnal dyspnea or shortness of breath Dermatological: negative for rash Respiratory: negative for cough or wheezing Urologic: negative for hematuria Abdominal: negative for nausea, vomiting, diarrhea, bright red blood per rectum, melena, or hematemesis Neurologic: negative for visual changes, syncope, or dizziness All other systems reviewed and are otherwise negative except as noted above.   Physical Exam:  Blood pressure 112/68, pulse (!) 54, height 5\' 11"  (1.803 m), weight 193 lb (87.5 kg), SpO2 99 %.  Affect appropriate Healthy:  appears stated age 38: normal Neck supple with no adenopathy JVP normal no bruits no thyromegaly Lungs clear with no wheezing and good diaphragmatic motion Heart:  S1/S2 no murmur, no rub, gallop or click PMI normal Abdomen: benighn, BS positve, no tenderness, no AAA no bruit.  No HSM or HJR Distal pulses intact with no bruits No edema Neuro non-focal Skin warm and dry No muscular weakness   EKG   06/13/20  SR rate 54 biphasic T wave changes inferior lateral leads   ASSESSMENT AND PLAN:   1. Hypertensive heart disease: abnormal ECG concern for HOCM but MRI with no gadolinium uptake in 2016  Septal thickness only 14 mm MRI 06/02/19 and again no gadolinium uptake other than RV insertion site    2. Hypertension:  Well controlled.  Continue current medications and low sodium Dash type diet.    3. Tobacco abuse: Advised smoking cessation however patient is not interested in quitting at this time. CXR 08/2017 NAD f/u lung cancer screening CT   4. Hyperlipidemia: on statin therapy. Lipid profile is followed by his PCP. Patient advised to get upcomming fasting labs faxed to our office for review.   5. Erectile dysfunction: should be provided by primary Viagra dose 100 mg tab usual dose   6. Abnormal ECG: inferolateral biphasic T waves likely  related to HTN/LVH  Lung cancer screening cT F/U cardiology in a year   [name]

## 2020-06-11 ENCOUNTER — Encounter: Payer: Self-pay | Admitting: Gastroenterology

## 2020-06-11 ENCOUNTER — Ambulatory Visit: Payer: 59 | Admitting: Gastroenterology

## 2020-06-11 ENCOUNTER — Telehealth: Payer: Self-pay

## 2020-06-11 ENCOUNTER — Other Ambulatory Visit (INDEPENDENT_AMBULATORY_CARE_PROVIDER_SITE_OTHER): Payer: 59

## 2020-06-11 VITALS — BP 146/90 | HR 60 | Ht 71.0 in | Wt 193.0 lb

## 2020-06-11 DIAGNOSIS — R1011 Right upper quadrant pain: Secondary | ICD-10-CM

## 2020-06-11 LAB — COMPREHENSIVE METABOLIC PANEL
ALT: 20 U/L (ref 0–53)
AST: 21 U/L (ref 0–37)
Albumin: 4.6 g/dL (ref 3.5–5.2)
Alkaline Phosphatase: 73 U/L (ref 39–117)
BUN: 15 mg/dL (ref 6–23)
CO2: 29 mEq/L (ref 19–32)
Calcium: 10 mg/dL (ref 8.4–10.5)
Chloride: 104 mEq/L (ref 96–112)
Creatinine, Ser: 1.23 mg/dL (ref 0.40–1.50)
GFR: 61.18 mL/min (ref >60.00)
Glucose, Bld: 67 mg/dL — ABNORMAL LOW (ref 70–99)
Potassium: 4 mEq/L (ref 3.5–5.1)
Sodium: 139 mEq/L (ref 135–145)
Total Bilirubin: 0.5 mg/dL (ref 0.2–1.2)
Total Protein: 7.6 g/dL (ref 6.0–8.3)

## 2020-06-11 LAB — CBC WITH DIFFERENTIAL/PLATELET
Basophils Absolute: 0.1 10*3/uL (ref 0.0–0.1)
Basophils Relative: 1.4 % (ref 0.0–3.0)
Eosinophils Absolute: 0.2 10*3/uL (ref 0.0–0.7)
Eosinophils Relative: 3.5 % (ref 0.0–5.0)
HCT: 45.2 % (ref 39.0–52.0)
Hemoglobin: 14.3 g/dL (ref 13.0–17.0)
Lymphocytes Relative: 46.2 % — ABNORMAL HIGH (ref 12.0–46.0)
Lymphs Abs: 3.1 10*3/uL (ref 0.7–4.0)
MCHC: 31.7 g/dL (ref 30.0–36.0)
MCV: 91 fl (ref 78.0–100.0)
Monocytes Absolute: 0.5 10*3/uL (ref 0.1–1.0)
Monocytes Relative: 7.4 % (ref 3.0–12.0)
Neutro Abs: 2.8 10*3/uL (ref 1.4–7.7)
Neutrophils Relative %: 41.5 % — ABNORMAL LOW (ref 43.0–77.0)
Platelets: 277 10*3/uL (ref 150.0–400.0)
RBC: 4.97 Mil/uL (ref 4.22–5.81)
RDW: 12.8 % (ref 11.5–15.5)
WBC: 6.7 10*3/uL (ref 4.0–10.5)

## 2020-06-11 NOTE — Progress Notes (Signed)
Luke Wheeler    [id number]    1956-07-02  Primary Care Physician:Long, Lucienne Capers  Referring Physician: Shanon Rosser, [name] [place],  [place] [phone]   Chief complaint: Right upper quadrant abdominal pain  HPI: 64 year old very pleasant gentleman here for right upper quadrant abdominal pain. He has been having on and off gastric and right upper abdominal pain for past 3 months. He has an episode once every few weeks, mostly feels like discomfort. He was able to pinpoint it to an area that he feels the pain.   Denies any relationship with diet or bowel habits. No recent change in bowel habits. He has intermittent constipation. No melena or rectal bleeding.   Denies frequent use of NSAIDs  He retired from 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  No evidence of locally advanced or pelvic metastatic disease on MRI in 2019  Is otherwise overall healthy, has no other complaints.  Colonoscopy Jan 11, 2017: 6 sessile polyps removed from rectum, sigmoid colon and cecum.  Colonic diverticulosis and internal hemorrhoids. Only the polyp removed from cecum was tubular adenoma, rest were hyperplastic.  He received [employer] Emery [employer] Covid vaccination  Outpatient Encounter Medications as of 06/11/2020  Medication Sig   amoxicillin-clavulanate (AUGMENTIN) 875-125 MG tablet Take 1 tablet by mouth every 12 (twelve) hours. BEFORE ROCEDURES   aspirin EC 81 MG EC tablet Take 1 tablet (81 mg total) by mouth daily.   fluvastatin (LESCOL) 20 MG capsule Take 1 capsule (20 mg total) by mouth daily.   losartan-hydrochlorothiazide (HYZAAR) 50-12.5 MG tablet Take 1 tablet by mouth 2 (two) times daily.   sildenafil (VIAGRA) 100 MG tablet Take 1 tablet (100 mg total) by mouth daily as needed for erectile dysfunction.   verapamil (CALAN-SR) 120 MG CR tablet Take 1 tablet (120 mg total) by mouth at bedtime.    Facility-Administered Encounter Medications as of 06/11/2020  Medication   0.9 %  sodium chloride infusion    Allergies as of 06/11/2020   (No Known Allergies)    Past Medical History:  Diagnosis Date   Cataract    High cholesterol    Hx of cardiovascular stress test    ETT-Myoview 4/16:  No ischemia, EF 50%, normal wall motion, normal study   Hx of echocardiogram    Echo 11/15:  mod asymmetric septal hypertrophy, no LVOT gradient, EF 55%, no RWMA, Gr 1 DD, septal 16 mm, post wall 11.2 mm, no SAM, trivial MR, normal RVF, mild RAE (HCM variant vs LVH)   Hypertension    LVH (left ventricular hypertrophy)    non-obstructive HCM vs LVH on echo 06/2014   Snoring     Past Surgical History:  Procedure Laterality Date   CATARACT EXTRACTION, BILATERAL     WISDOM TOOTH EXTRACTION      Family History  Problem Relation Age of Onset   Diabetes Mother    Hyperlipidemia Father    Stroke Father    Coronary artery disease Father    Colon cancer Neg Hx     Social History   Socioeconomic History   Marital status: Divorced    Spouse name: Not on file   Number of children: Not on file   Years of education: Not on file   Highest education level: Not on file  Occupational History   Not on file  Tobacco Use   Smoking status: Current Every Day  Smoker   Smokeless tobacco: Never Used  Vaping Use   Vaping Use: Never used  Substance and Sexual Activity   Alcohol use: Yes    Comment: twice monthly   Drug use: No   Sexual activity: Not on file  Other Topics Concern   Not on file  Social History Narrative   Not on file   Social Determinants of Health   Financial Resource Strain:    Difficulty of Paying Living Expenses: Not on file  Food Insecurity:    Worried About [place] in the Last Year: Not on file   Ran Out of Food in the Last Year: Not on file  Transportation Needs:    Lack of Transportation (Medical): Not on file   Lack  of Transportation (Non-Medical): Not on file  Physical Activity:    Days of Exercise per Week: Not on file   Minutes of Exercise per Session: Not on file  Stress:    Feeling of Stress : Not on file  Social Connections:    Frequency of Communication with Friends and Family: Not on file   Frequency of Social Gatherings with Friends and Family: Not on file   Attends Religious Services: Not on file   Active Member of Clubs or Organizations: Not on file   Attends Archivist Meetings: Not on file   Marital Status: Not on file  Intimate Partner Violence:    Fear of Current or Ex-Partner: Not on file   Emotionally Abused: Not on file   Physically Abused: Not on file   Sexually Abused: Not on file      Review of systems: All other review of systems negative except as mentioned in the HPI.   Physical Exam: Vitals:   06/11/20 0948  BP: (!) 146/90  Pulse: 60   Body mass index is 26.92 kg/m. Gen:      No acute distress HEENT:  sclera anicteric Abd:      soft, mild right upper quadrant tenderness with positive Murphy's sign ; no palpable masses, no distension Ext:    No edema Neuro: alert and oriented x 3 Psych: normal mood and affect  Data Reviewed:  Reviewed labs, radiology imaging, old records and pertinent past GI work up   Assessment and Plan/Recommendations:  64 year old very pleasant gentleman with complaints of right upper quadrant abdominal pain  Obtain right upper quadrant abdominal ultrasound to exclude gallstones or gallbladder disease Check CBC and CMP  We will also need to exclude peptic ulcer disease or gastroduodenitis or H. pylori bacterial infection. Schedule for EGD for further evaluation The risks and benefits as well as alternatives of endoscopic procedure(s) have been discussed and reviewed. All questions answered. The patient agrees to proceed.  Further management based on findings of abdominal ultrasound and EGD  Increase  dietary fiber and fluid intake to prevent constipation  Due for recall surveillance colonoscopy May 2023, has history of adenomatous colon polyps   The patient was provided an opportunity to ask questions and all were answered. The patient agreed with the plan and demonstrated an understanding of the instructions.  Damaris Hippo , MD    CC: Shanon Rosser, [name]

## 2020-06-11 NOTE — Telephone Encounter (Signed)
-----   Message from Mauri Pole, MD sent at 06/11/2020  2:51 PM EDT ----- Mr. Shonte, blood glucose level was slightly low, otherwise no other significant abnormalities.  Liver function test within normal range.

## 2020-06-11 NOTE — Patient Instructions (Signed)
You have been scheduled for an abdominal ultrasound at [facility] (1st floor of hospital) on 06/19/2020 at 9:45am. Please arrive 15 minutes prior to your appointment for registration. Make certain not to have anything to eat or drink after midnight prior to your appointment. Should you need to reschedule your appointment, please contact radiology at [phone]. This test typically takes about 30 minutes to perform.  You have been scheduled for an endoscopy. Please follow written instructions given to you at your visit today. If you use inhalers (even only as needed), please bring them with you on the day of your procedure.   Your provider has requested that you go to the basement level for lab work before leaving today. Press "B" on the elevator. The lab is located at the first door on the left as you exit the elevator.  Your recall colonoscopy will be in 12/2021, we will mail you a reminder letter when its due  Due to recent changes in healthcare laws, you may see the results of your imaging and laboratory studies on MyChart before your provider has had a chance to review them.  We understand that in some cases there may be results that are confusing or concerning to you. Not all laboratory results come back in the same time frame and the provider may be waiting for multiple results in order to interpret others.  Please give [place] 48 hours in order for your provider to thoroughly review all the results before contacting the office for clarification of your results.   Thank you for choosing [facility]  Karleen Hampshire Nandigam,MD

## 2020-06-11 NOTE — Telephone Encounter (Signed)
Left message for patient to please call back. 

## 2020-06-12 NOTE — Telephone Encounter (Signed)
Pt is requesting a call back from a nurse. 

## 2020-06-13 ENCOUNTER — Other Ambulatory Visit: Payer: Self-pay

## 2020-06-13 ENCOUNTER — Encounter: Payer: Self-pay | Admitting: Cardiovascular Disease

## 2020-06-13 ENCOUNTER — Ambulatory Visit: Payer: 59 | Admitting: Cardiovascular Disease

## 2020-06-13 VITALS — BP 112/68 | HR 54 | Ht 71.0 in | Wt 193.0 lb

## 2020-06-13 DIAGNOSIS — I10 Essential (primary) hypertension: Secondary | ICD-10-CM | POA: Diagnosis not present

## 2020-06-13 DIAGNOSIS — Z87891 Personal history of nicotine dependence: Secondary | ICD-10-CM | POA: Diagnosis not present

## 2020-06-13 DIAGNOSIS — I429 Cardiomyopathy, unspecified: Secondary | ICD-10-CM | POA: Diagnosis not present

## 2020-06-13 NOTE — Patient Instructions (Addendum)
Medication Instructions:  *If you need a refill on your cardiac medications before your next appointment, please call your pharmacy*  Lab Work: If you have labs (blood work) drawn today and your tests are completely normal, you will receive your results only by: Marland Kitchen MyChart Message (if you have MyChart) OR . A paper copy in the mail If you have any lab test that is abnormal or we need to change your treatment, we will call you to review the results.  Testing/Procedures: Non-Cardiac CT scanning for lung cancer screening, (CAT scanning), is a noninvasive, special x-ray that produces cross-sectional images of the body using x-rays and a computer. CT scans help physicians diagnose and treat medical conditions. For some CT exams, a contrast material is used to enhance visibility in the area of the body being studied. CT scans provide greater clarity and reveal more details than regular x-ray exams.  Follow-Up: At [facility], you and your health needs are our priority.  As part of our continuing mission to provide you with exceptional heart care, we have created designated Provider Care Teams.  These Care Teams include your primary Cardiologist (physician) and Advanced Practice Providers (APPs -  Physician Assistants and Nurse Practitioners) who all work together to provide you with the care you need, when you need it.  We recommend signing up for the patient portal called "MyChart".  Sign up information is provided on this After Visit Summary.  MyChart is used to connect with patients for Virtual Visits (Telemedicine).  Patients are able to view lab/test results, encounter notes, upcoming appointments, etc.  Non-urgent messages can be sent to your provider as well.   To learn more about what you can do with MyChart, go to [email or web address].    Your next appointment:   12 month(s)  The format for your next appointment:   In Person  Provider:   You may see [name], MD or one of the  following Advanced Practice Providers on your designated Care Team:    Truitt Merle, NP  Cecilie Kicks, NP  Kathyrn Drown, NP

## 2020-06-17 NOTE — Telephone Encounter (Signed)
Called patient back and gave lab results.

## 2020-06-19 ENCOUNTER — Other Ambulatory Visit: Payer: Self-pay

## 2020-06-19 ENCOUNTER — Ambulatory Visit (HOSPITAL_COMMUNITY)
Admission: RE | Admit: 2020-06-19 | Discharge: 2020-06-19 | Disposition: A | Discharge status: Home / Self Care | Payer: Medicare Other | Source: Ambulatory Visit | Attending: Gastroenterology | Admitting: Gastroenterology

## 2020-06-19 DIAGNOSIS — R1011 Right upper quadrant pain: Secondary | ICD-10-CM | POA: Diagnosis present

## 2020-06-26 ENCOUNTER — Ambulatory Visit (INDEPENDENT_AMBULATORY_CARE_PROVIDER_SITE_OTHER)
Admission: RE | Admit: 2020-06-26 | Discharge: 2020-06-26 | Disposition: A | Discharge status: Home / Self Care | Payer: Medicare Other | Source: Ambulatory Visit | Attending: Cardiovascular Disease | Admitting: Cardiovascular Disease

## 2020-06-26 ENCOUNTER — Other Ambulatory Visit: Payer: Self-pay

## 2020-06-26 DIAGNOSIS — Z87891 Personal history of nicotine dependence: Secondary | ICD-10-CM | POA: Diagnosis not present

## 2020-07-04 ENCOUNTER — Ambulatory Visit (AMBULATORY_SURGERY_CENTER): Payer: Medicare Other | Admitting: Gastroenterology

## 2020-07-04 ENCOUNTER — Encounter: Payer: Self-pay | Admitting: Gastroenterology

## 2020-07-04 ENCOUNTER — Other Ambulatory Visit: Payer: Self-pay

## 2020-07-04 VITALS — BP 155/90 | HR 57 | Temp 97.8°F | Resp 16 | Ht 71.0 in | Wt 193.0 lb

## 2020-07-04 DIAGNOSIS — R1011 Right upper quadrant pain: Secondary | ICD-10-CM

## 2020-07-04 DIAGNOSIS — K2100 Gastro-esophageal reflux disease with esophagitis, without bleeding: Secondary | ICD-10-CM | POA: Diagnosis not present

## 2020-07-04 DIAGNOSIS — K269 Duodenal ulcer, unspecified as acute or chronic, without hemorrhage or perforation: Secondary | ICD-10-CM | POA: Diagnosis not present

## 2020-07-04 DIAGNOSIS — K2970 Gastritis, unspecified, without bleeding: Secondary | ICD-10-CM

## 2020-07-04 DIAGNOSIS — K2980 Duodenitis without bleeding: Secondary | ICD-10-CM | POA: Diagnosis not present

## 2020-07-04 MED ORDER — OMEPRAZOLE 40 MG PO CPDR: 40.0000 mg | DELAYED_RELEASE_CAPSULE | Freq: Two times a day (BID) | ORAL | 0 refills | Status: DC

## 2020-07-04 MED ORDER — SODIUM CHLORIDE 0.9 % IV SOLN: 500.0000 mL | Freq: Once | INTRAVENOUS | Status: DC

## 2020-07-04 NOTE — Op Note (Signed)
[facility] Patient Name: Luke Wheeler Procedure Date: 07/04/2020 9:55 AM MRN: [id number] Endoscopist: Mauri Pole , MD Age: 64 Referring MD:  Date of Birth: December 16, 1955 Gender: Male Account #: [id number] Procedure:                Upper GI endoscopy Indications:              Epigastric abdominal pain, Abdominal pain in the                            right upper quadrant Medicines:                Monitored Anesthesia Care Procedure:                Pre-Anesthesia Assessment:                           - Prior to the procedure, a History and Physical                            was performed, and patient medications and                            allergies were reviewed. The patient's tolerance of                            previous anesthesia was also reviewed. The risks                            and benefits of the procedure and the sedation                            options and risks were discussed with the patient.                            All questions were answered, and informed consent                            was obtained. Prior Anticoagulants: The patient has                            taken no previous anticoagulant or antiplatelet                            agents. ASA Grade Assessment: II - A patient with                            mild systemic disease. After reviewing the risks                            and benefits, the patient was deemed in                            satisfactory condition to undergo the procedure.  After obtaining informed consent, the endoscope was                            passed under direct vision. Throughout the                            procedure, the patient's blood pressure, pulse, and                            oxygen saturations were monitored continuously. The                            Endoscope was introduced through the mouth, and                            advanced to the second  part of duodenum. The upper                            GI endoscopy was accomplished without difficulty.                            The patient tolerated the procedure well. Scope In: Scope Out: Findings:                 LA Grade A (one or more mucosal breaks less than 5                            mm, not extending between tops of 2 mucosal folds)                            esophagitis was found 37 to 38 cm from the incisors.                           The gastroesophageal flap valve was visualized                            endoscopically and classified as Hill Grade III                            (minimal fold, loose to endoscope, hiatal hernia                            likely).                           Patchy mild inflammation characterized by                            congestion (edema), erosions, erythema and                            friability was found in the prepyloric region of  the stomach. Biopsies were taken with a cold                            forceps for Helicobacter pylori testing.                           Few non-bleeding linear and superficial duodenal                            ulcers with a clean ulcer base (Forrest Class III)                            were found in the duodenal bulb. The largest lesion                            was 3 mm in largest dimension. Biopsies were taken                            with a cold forceps for histology.                           The second portion of the duodenum was normal. Complications:            No immediate complications. Estimated Blood Loss:     Estimated blood loss was minimal. Impression:               - LA Grade A reflux esophagitis.                           - Gastroesophageal flap valve classified as Hill                            Grade III (minimal fold, loose to endoscope, hiatal                            hernia likely).                           - Gastritis. Biopsied.                            - Non-bleeding duodenal ulcers with a clean ulcer                            base (Forrest Class III). Biopsied.                           - Normal second portion of the duodenum. Recommendation:           - Resume previous diet.                           - Continue present medications.                           - Await pathology results.                           -  Use Prilosec (omeprazole) 40 mg PO BID for 2                            months followed by once daily for additional 3-4                            months.                           - No ibuprofen, naproxen, or other non-steroidal                            anti-inflammatory drugs.                           - GI office follow up visit in 3-4 months Mauri Pole, MD 07/04/2020 10:23:58 AM This report has been signed electronically.

## 2020-07-04 NOTE — Progress Notes (Signed)
Pt's states no medical or surgical changes since previsit or office visit.  CW - vitals 

## 2020-07-04 NOTE — Progress Notes (Signed)
Report to PACU, RN, vss, BBS= Clear.  

## 2020-07-04 NOTE — Progress Notes (Signed)
Called to room to assist during endoscopic procedure.  Patient ID and intended procedure confirmed with present staff. Received instructions for my participation in the procedure from the performing physician.  

## 2020-07-04 NOTE — Patient Instructions (Addendum)
NO IBUPROFEN ,NAPROXEN OR OTHER NON STEROIDAL ANTI INFLAMMATORY DRIGS  Handout on gastritis given to you today   Await biopsy results -biopsies taken of duodenal ulcer area and of inflammation in stomach  MAKE AN APPOINTMENT TO SEE DR NANDIGAM IN OFFICE IN 3-4 MONTHS   CONTINUE PRILOSEC ( OMEPRAZOLE 40 MG TWICE DAILY FOR 2 MONTHS THEN BACK TO ONE DAILY FOR ADDITIONAL 3-4 MONTHS- PICK UP @ [facility]    YOU HAD AN ENDOSCOPIC PROCEDURE TODAY AT [place]:   Refer to the procedure report that was given to you for any specific questions about what was found during the examination.  If the procedure report does not answer your questions, please call your gastroenterologist to clarify.  If you requested that your care partner not be given the details of your procedure findings, then the procedure report has been included in a sealed envelope for you to review at your convenience later.  YOU SHOULD EXPECT: Some feelings of bloating in the abdomen. Passage of more gas than usual.  Walking can help get rid of the air that was put into your GI tract during the procedure and reduce the bloating. If you had a lower endoscopy (such as a colonoscopy or flexible sigmoidoscopy) you may notice spotting of blood in your stool or on the toilet paper. If you underwent a bowel prep for your procedure, you may not have a normal bowel movement for a few days.  Please Note:  You might notice some irritation and congestion in your nose or some drainage.  This is from the oxygen used during your procedure.  There is no need for concern and it should clear up in a day or so.  SYMPTOMS TO REPORT IMMEDIATELY:     Following upper endoscopy (EGD)  Vomiting of blood or coffee ground material  New chest pain or pain under the shoulder blades  Painful or persistently difficult swallowing  New shortness of breath  Fever of 100F or higher  Black, tarry-looking stools  For urgent or emergent issues, a  gastroenterologist can be reached at any hour by calling [phone]. Do not use MyChart messaging for urgent concerns.    DIET:  We do recommend a small meal at first, but then you may proceed to your regular diet.  Drink plenty of fluids but you should avoid alcoholic beverages for 24 hours.  ACTIVITY:  You should plan to take it easy for the rest of today and you should NOT DRIVE or use heavy machinery until tomorrow (because of the sedation medicines used during the test).    FOLLOW UP: Our staff will call the number listed on your records 48-72 hours following your procedure to check on you and address any questions or concerns that you may have regarding the information given to you following your procedure. If we do not reach you, we will leave a message.  We will attempt to reach you two times.  During this call, we will ask if you have developed any symptoms of COVID 19. If you develop any symptoms (ie: fever, flu-like symptoms, shortness of breath, cough etc.) before then, please call [phone].  If you test positive for Covid 19 in the 2 weeks post procedure, please call and report this information to [place].    If any biopsies were taken you will be contacted by phone or by letter within the next 1-3 weeks.  Please call us at [phone] if you have not heard about the  biopsies in 3 weeks.    SIGNATURES/CONFIDENTIALITY: You and/or your care partner have signed paperwork which will be entered into your electronic medical record.  These signatures attest to the fact that that the information above on your After Visit Summary has been reviewed and is understood.  Full responsibility of the confidentiality of this discharge information lies with you and/or your care-partner.

## 2020-07-08 ENCOUNTER — Telehealth: Payer: Self-pay

## 2020-07-08 NOTE — Telephone Encounter (Signed)
°  Follow up Call-  Call back number 07/04/2020  Post procedure Call Back phone  # [phone]  Permission to leave phone message Yes  Some recent data might be hidden     Patient questions:  Do you have a fever, pain , or abdominal swelling? Yes.   Pain Score  0 *  Have you tolerated food without any problems? Yes.    Have you been able to return to your normal activities? Yes.    Do you have any questions about your discharge instructions: Diet   No. Medications  No. Follow up visit  No.  Do you have questions or concerns about your Care? No.  Actions: * If pain score is 4 or above: No action needed, pain <4.  1. Have you developed a fever since your procedure? no  2.   Have you had an respiratory symptoms (SOB or cough) since your procedure? no  3.   Have you tested positive for COVID 19 since your procedure no  4.   Have you had any family members/close contacts diagnosed with the COVID 19 since your procedure? no   If yes to any of these questions please route to Joylene John, RN and Joella Prince, RN

## 2020-07-11 ENCOUNTER — Telehealth: Payer: Self-pay

## 2020-07-11 ENCOUNTER — Other Ambulatory Visit: Payer: Self-pay

## 2020-07-11 DIAGNOSIS — A048 Other specified bacterial intestinal infections: Secondary | ICD-10-CM

## 2020-07-11 MED ORDER — METRONIDAZOLE 250 MG PO TABS: 250.0000 mg | ORAL_TABLET | Freq: Four times a day (QID) | ORAL | 0 refills | Status: DC

## 2020-07-11 MED ORDER — DOXYCYCLINE HYCLATE 100 MG PO CAPS: 100.0000 mg | ORAL_CAPSULE | Freq: Two times a day (BID) | ORAL | 0 refills | Status: DC

## 2020-07-11 MED ORDER — BISMUTH SUBSALICYLATE 262 MG PO CHEW: 524.0000 mg | CHEWABLE_TABLET | Freq: Four times a day (QID) | ORAL | 0 refills | Status: DC

## 2020-07-11 NOTE — Telephone Encounter (Signed)
-----   Message from Mauri Pole, MD sent at 07/11/2020  5:42 AM EST ----- H.pylori positive. Please inform patient the results.   Please send prescription for Pylera X 10 days and PPI BID, if not covered by insurance send Rx for Bismuth 524 mg four times daily, Flagyl 250mg  1 tablet four times daily, Doxycycline 100mg  capsule Twice daily X 10 days along with PPI BID (Lansoprazole, Omeprazole or Nexium).  Will need to confirm eradication in 6 weeks by checking H.pylori stool Ag, off PPI for 2-3 weeks prior to the test.   Thanks

## 2020-07-11 NOTE — Telephone Encounter (Signed)
Left message to please call back. °

## 2020-07-22 ENCOUNTER — Telehealth: Payer: Self-pay | Admitting: Gastroenterology

## 2020-07-22 ENCOUNTER — Other Ambulatory Visit: Payer: Self-pay

## 2020-07-22 DIAGNOSIS — R1011 Right upper quadrant pain: Secondary | ICD-10-CM

## 2020-07-22 DIAGNOSIS — K2970 Gastritis, unspecified, without bleeding: Secondary | ICD-10-CM

## 2020-07-22 MED ORDER — BISMUTH SUBSALICYLATE 262 MG PO CHEW: 524.0000 mg | CHEWABLE_TABLET | Freq: Four times a day (QID) | ORAL | 0 refills | Status: DC

## 2020-07-22 MED ORDER — METRONIDAZOLE 250 MG PO TABS: 250.0000 mg | ORAL_TABLET | Freq: Four times a day (QID) | ORAL | 0 refills | Status: AC

## 2020-07-22 MED ORDER — OMEPRAZOLE 40 MG PO CPDR: 40.0000 mg | DELAYED_RELEASE_CAPSULE | Freq: Two times a day (BID) | ORAL | 0 refills | Status: DC

## 2020-07-22 MED ORDER — DOXYCYCLINE HYCLATE 100 MG PO CAPS: 100.0000 mg | ORAL_CAPSULE | Freq: Two times a day (BID) | ORAL | 0 refills | Status: AC

## 2020-07-22 NOTE — Telephone Encounter (Signed)
Patient called states he has not received any of the antibiotic medications

## 2020-07-22 NOTE — Telephone Encounter (Signed)
Prescriptions for treating the H Pylori infection all have confirmation of being received by the pharmacy. Re-submitted the prescriptions after confirming with the patient that I had the correct pharmacy.

## 2020-09-03 ENCOUNTER — Telehealth: Payer: Self-pay

## 2020-09-03 NOTE — Telephone Encounter (Signed)
-----   Message from Luke Closs, RN sent at 07/11/2020 11:27 AM EST ----- Call patient to come in for H-pylori stool test on 09/02/20

## 2020-09-03 NOTE — Telephone Encounter (Signed)
Called patient to come in for his H-Pylori stool test. He had not stopped his Omeprazole. He is going to stop it today and come in on 09/18/20 for his stool test

## 2020-09-19 ENCOUNTER — Other Ambulatory Visit: Payer: 59

## 2020-09-19 DIAGNOSIS — A048 Other specified bacterial intestinal infections: Secondary | ICD-10-CM

## 2020-09-20 LAB — HELICOBACTER PYLORI  SPECIAL ANTIGEN
MICRO NUMBER:: 11465023
SPECIMEN QUALITY: ADEQUATE

## 2020-09-23 ENCOUNTER — Telehealth: Payer: Self-pay

## 2020-09-23 NOTE — Telephone Encounter (Signed)
-----   Message from Mauri Pole, MD sent at 09/23/2020 12:54 PM EST ----- H.pylori was successfully eradicated. Stool test is negative. Please inform patient the results. Thanks

## 2020-09-23 NOTE — Telephone Encounter (Signed)
Patient called back, let him know his stool sample came back negative for H-pylori. It has been eradicated

## 2020-09-23 NOTE — Telephone Encounter (Signed)
Left message for patient to please call back. 

## 2020-09-30 NOTE — Telephone Encounter (Signed)
Pt is requesting a call back from a nurse named Sherlynn Stalls, pt did not disclose any further info,

## 2020-09-30 NOTE — Telephone Encounter (Signed)
Pt aware.

## 2021-07-07 NOTE — Progress Notes (Signed)
07/07/2021 Luke Wheeler   Apr 07, 1956  [id number]  Primary Physician Long, [name], [name] Primary Cardiologist: [name]    HPI: 65 y.o. HTN and asymmetric septal hypertrophy No evidence of HOCM by cardiac MRI October 2016 and 06/02/19 with septal thickness 14 mm only LVEF 56%  Atypical chest pain April 2016 with normal myovue EF 63% Does not like beta blocker for HTN due to erectile dysfunction Still smoking 1/2 ppd Takes Lescol for HLD labs followed by primary   Two boys age 9/15   They are at day school Wife in [place] works for division of [employer]  He retired last year  Relaxing and playing some tennis  Quit smoking in October   Discussed d/c ASA  No outpatient medications have been marked as taking for the 07/14/21 encounter (Appointment) with [name], MD.   Current Facility-Administered Medications for the 07/14/21 encounter (Appointment) with [name], MD  Medication   0.9 %  sodium chloride infusion   No Known Allergies Past Medical History:  Diagnosis Date   Cataract    High cholesterol    Hx of cardiovascular stress test    ETT-Myoview 4/16:  No ischemia, EF 50%, normal wall motion, normal study   Hx of echocardiogram    Echo 11/15:  mod asymmetric septal hypertrophy, no LVOT gradient, EF 55%, no RWMA, Gr 1 DD, septal 16 mm, post wall 11.2 mm, no SAM, trivial MR, normal RVF, mild RAE (HCM variant vs LVH)   Hypertension    LVH (left ventricular hypertrophy)    non-obstructive HCM vs LVH on echo 06/2014   Snoring    Family History  Problem Relation Age of Onset   Diabetes Mother    Hyperlipidemia Father    Stroke Father    Coronary artery disease Father    Colon cancer Neg Hx    Esophageal cancer Neg Hx    Rectal cancer Neg Hx    Stomach cancer Neg Hx    Past Surgical History:  Procedure Laterality Date   CATARACT EXTRACTION, BILATERAL     WISDOM TOOTH EXTRACTION     Social History   Socioeconomic History   Marital status:  Married    Spouse name: Not on file   Number of children: 4   Years of education: Not on file   Highest education level: Not on file  Occupational History   Not on file  Tobacco Use   Smoking status: Every Day    Types: Cigarettes   Smokeless tobacco: Never  Vaping Use   Vaping Use: Never used  Substance and Sexual Activity   Alcohol use: Yes    Comment: twice monthly   Drug use: No   Sexual activity: Yes  Other Topics Concern   Not on file  Social History Narrative   Not on file   Social Determinants of Health   Financial Resource Strain: Not on file  Food Insecurity: Not on file  Transportation Needs: Not on file  Physical Activity: Not on file  Stress: Not on file  Social Connections: Not on file  Intimate Partner Violence: Not on file     Review of Systems: General: negative for chills, fever, night sweats or weight changes.  Cardiovascular: negative for chest pain, dyspnea on exertion, edema, orthopnea, palpitations, paroxysmal nocturnal dyspnea or shortness of breath Dermatological: negative for rash Respiratory: negative for cough or wheezing Urologic: negative for hematuria Abdominal: negative for nausea, vomiting, diarrhea, bright red blood per rectum, melena, or hematemesis  Neurologic: negative for visual changes, syncope, or dizziness All other systems reviewed and are otherwise negative except as noted above.   Physical Exam:  There were no vitals taken for this visit.  Affect appropriate Healthy:  appears stated age 84: normal Neck supple with no adenopathy JVP normal no bruits no thyromegaly Lungs clear with no wheezing and good diaphragmatic motion Heart:  S1/S2 no murmur, no rub, gallop or click PMI normal Abdomen: benighn, BS positve, no tenderness, no AAA no bruit.  No HSM or HJR Distal pulses intact with no bruits No edema Neuro non-focal Skin warm and dry No muscular weakness   EKG   07/14/2021   SR rate 60 biphasic T wave changes  inferior lateral leads chronic   ASSESSMENT AND PLAN:   1. Hypertensive heart disease: abnormal ECG concern for HOCM but MRI with no gadolinium uptake in 2016  Septal thickness only 14 mm MRI 06/02/19 and again no gadolinium uptake other than RV insertion site    2. Hypertension:  Well controlled.  Continue current medications and low sodium Dash type diet.    3. Tobacco abuse: Quit smoking last October congratulated him Lung cancer CT still in order   4. Hyperlipidemia: on statin therapy. Lipid profile is followed by his PCP. Patient advised to get upcomming fasting labs faxed to our office for review.   5. Erectile dysfunction: should be provided by primary Viagra dose 100 mg tab usual dose   6. Abnormal ECG: inferolateral biphasic T waves likely related to HTN/LVH  Lung cancer screening cT F/U cardiology in a year   [name]

## 2021-07-14 ENCOUNTER — Other Ambulatory Visit: Payer: Self-pay

## 2021-07-14 ENCOUNTER — Ambulatory Visit (INDEPENDENT_AMBULATORY_CARE_PROVIDER_SITE_OTHER): Payer: Medicare Other | Admitting: Cardiovascular Disease

## 2021-07-14 ENCOUNTER — Encounter: Payer: Self-pay | Admitting: Cardiovascular Disease

## 2021-07-14 VITALS — BP 138/62 | HR 60 | Ht 71.0 in | Wt 207.0 lb

## 2021-07-14 DIAGNOSIS — I10 Essential (primary) hypertension: Secondary | ICD-10-CM | POA: Diagnosis not present

## 2021-07-14 DIAGNOSIS — I429 Cardiomyopathy, unspecified: Secondary | ICD-10-CM

## 2021-07-14 DIAGNOSIS — Z87891 Personal history of nicotine dependence: Secondary | ICD-10-CM | POA: Diagnosis not present

## 2021-07-14 NOTE — Patient Instructions (Signed)
Medication Instructions:  The current medical regimen is effective;  continue present plan and medications.  *If you need a refill on your cardiac medications before your next appointment, please call your pharmacy*  Testing/Procedures: Lung cancer screening CT (CAT scanning), is a noninvasive, special x-ray that produces cross-sectional images of the body using x-rays and a computer. CT scans help physicians diagnose and treat medical conditions. For some CT exams, a contrast material is used to enhance visibility in the area of the body being studied. CT scans provide greater clarity and reveal more details than regular x-ray exams.  Follow-Up: At [facility], you and your health needs are our priority.  As part of our continuing mission to provide you with exceptional heart care, we have created designated Provider Care Teams.  These Care Teams include your primary Cardiologist (physician) and Advanced Practice Providers (APPs -  Physician Assistants and Nurse Practitioners) who all work together to provide you with the care you need, when you need it.  We recommend signing up for the patient portal called "MyChart".  Sign up information is provided on this After Visit Summary.  MyChart is used to connect with patients for Virtual Visits (Telemedicine).  Patients are able to view lab/test results, encounter notes, upcoming appointments, etc.  Non-urgent messages can be sent to your provider as well.   To learn more about what you can do with MyChart, go to [email or web address].    Your next appointment:   1 year(s)  The format for your next appointment:   In Person  Provider:   [name], MD     Thank you for choosing [facility]!!

## 2021-07-24 ENCOUNTER — Ambulatory Visit (HOSPITAL_COMMUNITY)
Admission: RE | Admit: 2021-07-24 | Discharge: 2021-07-24 | Disposition: A | Discharge status: Home / Self Care | Payer: Medicare Other | Source: Ambulatory Visit | Attending: Cardiovascular Disease | Admitting: Cardiovascular Disease

## 2021-07-24 ENCOUNTER — Other Ambulatory Visit: Payer: Self-pay

## 2021-07-24 DIAGNOSIS — Z87891 Personal history of nicotine dependence: Secondary | ICD-10-CM | POA: Insufficient documentation

## 2022-03-02 ENCOUNTER — Telehealth: Payer: Self-pay

## 2022-03-02 NOTE — Telephone Encounter (Signed)
NOTES SCANNED TO REFERRAL 

## 2022-03-19 ENCOUNTER — Encounter: Payer: Self-pay | Admitting: Gastroenterology

## 2022-04-14 NOTE — Progress Notes (Signed)
04/22/2022 Luke Wheeler   07-20-1956  [id number]  Primary Physician Long, [name], [name] Primary Cardiologist: [name]    HPI: 66 y.o. HTN and asymmetric septal hypertrophy No evidence of HOCM by cardiac MRI October 2016 and 06/02/19 with septal thickness 14 mm only LVEF 56%  Atypical chest pain April 2016 with normal myovue EF 63% Does not like beta blocker for HTN due to erectile dysfunction Quit smoking October 2021 Takes Lescol for HLD labs followed by primary   Two boys age 28/16   They are at day school Wife in [place] works for division of [employer]  He retired last year  Relaxing and playing some tennis  Sees [place]. Had atypical SSCP Some exertional some not tightness in chest   Current Meds  Medication Sig   aspirin EC 81 MG EC tablet Take 1 tablet (81 mg total) by mouth daily.   fluvastatin (LESCOL) 20 MG capsule Take 1 capsule (20 mg total) by mouth daily.   losartan (COZAAR) 100 MG tablet Take 1 tablet by mouth daily.   sildenafil (VIAGRA) 100 MG tablet Take 1 tablet (100 mg total) by mouth daily as needed for erectile dysfunction.   verapamil (CALAN-SR) 120 MG CR tablet Take 1 tablet (120 mg total) by mouth at bedtime.   Current Facility-Administered Medications for the 04/22/22 encounter (Office Visit) with [name], MD  Medication   0.9 %  sodium chloride infusion   No Known Allergies Past Medical History:  Diagnosis Date   Cataract    Chest pain    High cholesterol    Hx of cardiovascular stress test    ETT-Myoview 4/16:  No ischemia, EF 50%, normal wall motion, normal study   Hx of echocardiogram    Echo 11/15:  mod asymmetric septal hypertrophy, no LVOT gradient, EF 55%, no RWMA, Gr 1 DD, septal 16 mm, post wall 11.2 mm, no SAM, trivial MR, normal RVF, mild RAE (HCM variant vs LVH)   Hypertension    LVH (left ventricular hypertrophy)    non-obstructive HCM vs LVH on echo 06/2014   Snoring    Family History  Problem Relation Age of  Onset   Diabetes Mother    Hyperlipidemia Father    Stroke Father    Coronary artery disease Father    Colon cancer Neg Hx    Esophageal cancer Neg Hx    Rectal cancer Neg Hx    Stomach cancer Neg Hx    Past Surgical History:  Procedure Laterality Date   CATARACT EXTRACTION, BILATERAL     WISDOM TOOTH EXTRACTION     Social History   Socioeconomic History   Marital status: Married    Spouse name: Not on file   Number of children: 4   Years of education: Not on file   Highest education level: Not on file  Occupational History   Not on file  Tobacco Use   Smoking status: Every Day    Types: Cigarettes   Smokeless tobacco: Never  Vaping Use   Vaping Use: Never used  Substance and Sexual Activity   Alcohol use: Yes    Comment: twice monthly   Drug use: No   Sexual activity: Yes  Other Topics Concern   Not on file  Social History Narrative   Not on file   Social Determinants of Health   Financial Resource Strain: Not on file  Food Insecurity: Not on file  Transportation Needs: Not on file  Physical Activity: Not  on file  Stress: Not on file  Social Connections: Not on file  Intimate Partner Violence: Not on file     Review of Systems: General: negative for chills, fever, night sweats or weight changes.  Cardiovascular: negative for chest pain, dyspnea on exertion, edema, orthopnea, palpitations, paroxysmal nocturnal dyspnea or shortness of breath Dermatological: negative for rash Respiratory: negative for cough or wheezing Urologic: negative for hematuria Abdominal: negative for nausea, vomiting, diarrhea, bright red blood per rectum, melena, or hematemesis Neurologic: negative for visual changes, syncope, or dizziness All other systems reviewed and are otherwise negative except as noted above.   Physical Exam:  Blood pressure (!) 140/88, pulse (!) 55, height '5\' 11"'$  (1.803 m), weight 200 lb (90.7 kg), SpO2 99 %.  Affect appropriate Healthy:  appears stated  age 63: normal Neck supple with no adenopathy JVP normal no bruits no thyromegaly Lungs clear with no wheezing and good diaphragmatic motion Heart:  S1/S2 no murmur, no rub, gallop or click PMI normal Abdomen: benighn, BS positve, no tenderness, no AAA no bruit.  No HSM or HJR Distal pulses intact with no bruits No edema Neuro non-focal Skin warm and dry No muscular weakness   EKG   04/22/2022   SR rate 60 biphasic T wave changes inferior lateral leads chronic 04/22/2022 SR rate 52 chronically abnormal T wave changes laterally   ASSESSMENT AND PLAN:   1. Hypertensive heart disease: abnormal ECG concern for HOCM but MRI with no gadolinium uptake in 2016  Septal thickness only 14 mm MRI 06/02/19 and again no gadolinium uptake other than RV insertion site    2. Hypertension:  Well controlled.  Continue current medications and low sodium Dash type diet.    3. Tobacco abuse: Quit smoking October 2021 congratulated him Lung cancer CT 07/26/21 benign  4. Hyperlipidemia: on statin therapy. Lipid profile is followed by his PCP. Patient advised to get upcomming fasting labs faxed to our office for review.   5. Erectile dysfunction: should be provided by primary Viagra dose 100 mg tab usual dose   6. Abnormal ECG: inferolateral biphasic T waves likely related to HTN/LVH  7.  Chest Pain:  shared decision making favor cardiac CTA 50 gm lopressor 3 hours before   Cardiac CTA   F/U cardiology in a year   [employer]

## 2022-04-22 ENCOUNTER — Encounter: Payer: Self-pay | Admitting: Cardiovascular Disease

## 2022-04-22 ENCOUNTER — Ambulatory Visit: Payer: Medicare Other | Attending: Cardiovascular Disease | Admitting: Cardiovascular Disease

## 2022-04-22 VITALS — BP 140/88 | HR 55 | Ht 71.0 in | Wt 200.0 lb

## 2022-04-22 DIAGNOSIS — R9431 Abnormal electrocardiogram [ECG] [EKG]: Secondary | ICD-10-CM | POA: Diagnosis present

## 2022-04-22 DIAGNOSIS — I10 Essential (primary) hypertension: Secondary | ICD-10-CM | POA: Insufficient documentation

## 2022-04-22 DIAGNOSIS — R079 Chest pain, unspecified: Secondary | ICD-10-CM | POA: Insufficient documentation

## 2022-04-22 DIAGNOSIS — I429 Cardiomyopathy, unspecified: Secondary | ICD-10-CM | POA: Insufficient documentation

## 2022-04-22 LAB — BASIC METABOLIC PANEL
BUN/Creatinine Ratio: 9 — ABNORMAL LOW (ref 10–24)
BUN: 12 mg/dL (ref 8–27)
CO2: 24 mmol/L (ref 20–29)
Calcium: 9.9 mg/dL (ref 8.6–10.2)
Chloride: 104 mmol/L (ref 96–106)
Creatinine, Ser: 1.34 mg/dL — ABNORMAL HIGH (ref 0.76–1.27)
Glucose: 96 mg/dL (ref 70–99)
Potassium: 4.7 mmol/L (ref 3.5–5.2)
Sodium: 141 mmol/L (ref 134–144)
eGFR: 58 mL/min/{1.73_m2} — ABNORMAL LOW (ref >59)

## 2022-04-22 MED ORDER — METOPROLOL TARTRATE 50 MG PO TABS: ORAL_TABLET | ORAL | 0 refills | Status: DC

## 2022-04-22 NOTE — Patient Instructions (Addendum)
Medication Instructions:  Your physician recommends that you continue on your current medications as directed. Please refer to the Current Medication list given to you today.  *If you need a refill on your cardiac medications before your next appointment, please call your pharmacy*  Lab Work: Your physician recommends that you have lab work today. BMET  If you have labs (blood work) drawn today and your tests are completely normal, you will receive your results only by: [place] (if you have MyChart) OR A paper copy in the mail If you have any lab test that is abnormal or we need to change your treatment, we will call you to review the results.  Testing/Procedures: Your physician has requested that you have cardiac CT. Cardiac computed tomography (CT) is a painless test that uses an x-ray machine to take clear, detailed pictures of your heart. For further information please visit [email or web address]. Please follow instruction sheet as given.  Follow-Up: At [facility], you and your health needs are our priority.  As part of our continuing mission to provide you with exceptional heart care, we have created designated Provider Care Teams.  These Care Teams include your primary Cardiologist (physician) and Advanced Practice Providers (APPs -  Physician Assistants and Nurse Practitioners) who all work together to provide you with the care you need, when you need it.  We recommend signing up for the patient portal called "MyChart".  Sign up information is provided on this After Visit Summary.  MyChart is used to connect with patients for Virtual Visits (Telemedicine).  Patients are able to view lab/test results, encounter notes, upcoming appointments, etc.  Non-urgent messages can be sent to your provider as well.   To learn more about what you can do with MyChart, go to [email or web address].    Your next appointment:   12 month(s)  The format for your next appointment:   In  Person  Provider:   [name], MD     Other Instructions   Your cardiac CT will be scheduled at one of the below locations:   [facility] [address] [place], [place] [address] [phone]   If scheduled at [facility], please arrive at the [facility] and Children's Entrance (Entrance C2) of [facility] 30 minutes prior to test start time. You can use the FREE valet parking offered at entrance C (encouraged to control the heart rate for the test)  Proceed to the [facility] Radiology Department (first floor) to check-in and test prep.  All radiology patients and guests should use entrance C2 at [facility], accessed from [facility], even though the hospital's physical address listed is [address]   Please follow these instructions carefully (unless otherwise directed):  Hold all erectile dysfunction medications at least 3 days (72 hrs) prior to test.  On the Night Before the Test: Be sure to Drink plenty of water. Do not consume any caffeinated/decaffeinated beverages or chocolate 12 hours prior to your test. Do not take any antihistamines 12 hours prior to your test.  On the Day of the Test: Drink plenty of water until 1 hour prior to the test. Do not eat any food 4 hours prior to the test. You may take your regular medications prior to the test.  Take metoprolol (Lopressor) 50 mg two hours prior to test.      After the Test: Drink plenty of water. After receiving IV contrast, you may experience a mild flushed feeling.  This is normal. On occasion, you may experience a mild rash up to 24 hours after the test. This is not dangerous. If this occurs, you can take Benadryl 25 mg and increase your fluid intake. If you experience trouble breathing, this can be serious. If it is severe call 911 IMMEDIATELY. If it is mild, please call our office.  We will call to schedule your test 2-4 weeks out understanding that  some insurance companies will need an authorization prior to the service being performed.   For non-scheduling related questions, please contact the cardiac imaging nurse navigator should you have any questions/concerns: Marchia Bond, Cardiac Imaging Nurse Navigator Gordy Clement, Cardiac Imaging Nurse Navigator [facility] Direct Office Dial: [phone]   For scheduling needs, including cancellations and rescheduling, please call [place], [phone].   Important Information About Sugar

## 2022-05-06 ENCOUNTER — Telehealth (HOSPITAL_COMMUNITY): Payer: Self-pay | Admitting: Emergency Medicine

## 2022-05-06 NOTE — Telephone Encounter (Signed)
Attempted to call patient regarding upcoming cardiac CT appointment. °Left message on voicemail with name and callback number °Cheralyn Oliver RN Navigator Cardiac Imaging °[facility] °[phone] Office °[phone] Cell ° °

## 2022-05-08 ENCOUNTER — Ambulatory Visit (HOSPITAL_COMMUNITY)
Admission: RE | Admit: 2022-05-08 | Discharge: 2022-05-08 | Disposition: A | Discharge status: Home / Self Care | Payer: Medicare Other | Source: Ambulatory Visit | Attending: Cardiovascular Disease | Admitting: Cardiovascular Disease

## 2022-05-08 DIAGNOSIS — R079 Chest pain, unspecified: Secondary | ICD-10-CM | POA: Diagnosis present

## 2022-05-08 DIAGNOSIS — I429 Cardiomyopathy, unspecified: Secondary | ICD-10-CM | POA: Diagnosis present

## 2022-05-08 DIAGNOSIS — I10 Essential (primary) hypertension: Secondary | ICD-10-CM | POA: Insufficient documentation

## 2022-05-08 DIAGNOSIS — I2510 Atherosclerotic heart disease of native coronary artery without angina pectoris: Secondary | ICD-10-CM

## 2022-05-08 MED ORDER — METOPROLOL TARTRATE 5 MG/5ML IV SOLN: 10.0000 mg | INTRAVENOUS | Status: DC | PRN

## 2022-05-08 MED ORDER — IOHEXOL 350 MG/ML SOLN
100.0000 mL | Freq: Once | INTRAVENOUS | Status: AC | PRN
  Administered 2022-05-08: 100 mL via INTRAVENOUS

## 2022-05-08 MED ORDER — DILTIAZEM HCL 25 MG/5ML IV SOLN: 10.0000 mg | INTRAVENOUS | Status: DC | PRN

## 2022-05-08 MED ORDER — NITROGLYCERIN 0.4 MG SL SUBL
0.8000 mg | SUBLINGUAL_TABLET | Freq: Once | SUBLINGUAL | Status: AC
  Administered 2022-05-08: 0.8 mg via SUBLINGUAL

## 2022-05-08 MED ORDER — NITROGLYCERIN 0.4 MG SL SUBL
SUBLINGUAL_TABLET | SUBLINGUAL | Status: AC
  Filled 2022-05-08: qty 2

## 2022-05-28 ENCOUNTER — Other Ambulatory Visit: Payer: Self-pay | Admitting: Urology

## 2022-05-28 DIAGNOSIS — C61 Malignant neoplasm of prostate: Secondary | ICD-10-CM

## 2022-05-28 DIAGNOSIS — R350 Frequency of micturition: Secondary | ICD-10-CM

## 2022-05-28 DIAGNOSIS — N138 Other obstructive and reflux uropathy: Secondary | ICD-10-CM

## 2022-06-25 ENCOUNTER — Ambulatory Visit
Admission: RE | Admit: 2022-06-25 | Discharge: 2022-06-25 | Disposition: A | Discharge status: Home / Self Care | Payer: 59 | Source: Ambulatory Visit | Attending: Urology | Admitting: Urology

## 2022-06-25 DIAGNOSIS — C61 Malignant neoplasm of prostate: Secondary | ICD-10-CM

## 2022-06-25 DIAGNOSIS — R350 Frequency of micturition: Secondary | ICD-10-CM

## 2022-06-25 DIAGNOSIS — N138 Other obstructive and reflux uropathy: Secondary | ICD-10-CM

## 2022-06-25 MED ORDER — GADOPICLENOL 0.5 MMOL/ML IV SOLN
10.0000 mL | Freq: Once | INTRAVENOUS | Status: AC | PRN
  Administered 2022-06-25: 10 mL via INTRAVENOUS

## 2022-10-06 ENCOUNTER — Ambulatory Visit: Payer: Self-pay | Admitting: Surgery

## 2022-10-06 DIAGNOSIS — Z720 Tobacco use: Secondary | ICD-10-CM

## 2022-10-06 NOTE — H&P (View-Only) (Signed)
Luke Wheeler [id number]   Referring Provider:  Kaleen Mask, [place]   Subjective   Chief Complaint: New Consultation (Right inguinal hernia)     History of Present Illness:    Very pleasant 67 year old male with history of left ventricular hypertrophy (asymmetric septal hypertrophy, followed by [name]), hypertension, high cholesterol and no previous abdominal surgeries who was referred for evaluation of a right inguinal hernia. A few months ago, while playing tennis he had acute pain in the right groin and on further evaluation was noted to have a right inguinal hernia.  Aside from playing tennis once a week he also works out fairly regularly including weightlifting.  He has continued to have this sensation intermittently but less severely, as he has curtailed his tennis and weightlifting activities since the diagnosis. He is retired from [employer], mining.  He does smoke 5 to 6 cigarettes a day... Been smoking since the age of 42, he did quit for about a year a couple years ago.  He states he will quit tomorrow.  He is originally from [place].   Review of Systems: A complete review of systems was obtained from the patient.  I have reviewed this information and discussed as appropriate with the patient.  See HPI as well for other ROS.   Medical History: Past Medical History:  Diagnosis Date   Hypertension     There is no problem list on file for this patient.   Past Surgical History:  Procedure Laterality Date   CATARACT EXTRACTION     wrist surgery       No Known Allergies  Current Outpatient Medications on File Prior to Visit  Medication Sig Dispense Refill   fluvastatin (LESCOL) 20 MG capsule fluvastatin 20 mg capsule  TAKE 1 CAPSULE BY MOUTH EVERY DAY     losartan (COZAAR) 100 MG tablet Take 1 tablet by mouth once daily     sildenafiL (VIAGRA) 100 MG tablet TAKE 1 TABLET BY MOUTH 1 HOUR BEFORE SEXUAL ACTIVITY. MAX 1 TABLET DAILY     verapamiL (CALAN-SR) 120 MG  SR tablet Take 1 tablet by mouth at bedtime     No current facility-administered medications on file prior to visit.    Family History  Problem Relation Age of Onset   Diabetes Mother    High blood pressure (Hypertension) Father    Hyperlipidemia (Elevated cholesterol) Father      Social History   Tobacco Use  Smoking Status Every Day   Types: Cigarettes  Smokeless Tobacco Never  Tobacco Comments   4-5 sticks/day     Social History   Socioeconomic History   Marital status: Married  Tobacco Use   Smoking status: Every Day    Types: Cigarettes   Smokeless tobacco: Never   Tobacco comments:    4-5 sticks/day  Vaping Use   Vaping Use: Never used  Substance and Sexual Activity   Alcohol use: Yes    Alcohol/week: 1.0 standard drink of alcohol    Types: 1 Glasses of wine per week   Drug use: Never    Objective:    Vitals:   10/06/22 1540  BP: (!) 146/90  Pulse: 86  Temp: 36.6 C (97.9 F)  SpO2: 98%  Weight: 91.3 kg (201 lb 3.2 oz)  Height: 180.3 cm ('5\' 11"'$ )  PainSc: 0-No pain    Body mass index is 28.06 kg/m.  Gen: A&Ox3, no distress  Unlabored respiration Abdomen is soft, nontender, nondistended.  There is a right inguinal  hernia with vasalva, no hernia on the left.   Assessment and Plan:  Diagnoses and all orders for this visit:  Non-recurrent unilateral inguinal hernia without obstruction or gangrene  We discussed the relevant anatomy and options for repair. I recommend an open approach and we discussed the technique of the procedure.  Discussed risks of bleeding, infection, pain, scarring, injury to structures in the area including nerves, blood vessels, bowel, bladder, risk of chronic pain, hernia recurrence, risk of seroma or hematoma, urinary retention, and risks of general anesthesia including cardiovascular, pulmonary, and thromboembolic complications.  Discussed his risk of complications including hernia recurrence will be increased with ongoing  tobacco abuse-he states he will quit smoking tomorrow.  Questions were answered.  Patient wishes to proceed with scheduling.     Dore Oquin Raquel James, MD

## 2022-10-06 NOTE — H&P (Signed)
Luke Wheeler [id number]   Referring Provider:  Kaleen Mask, [place]   Subjective   Chief Complaint: New Consultation (Right inguinal hernia)     History of Present Illness:    Very pleasant 67 year old male with history of left ventricular hypertrophy (asymmetric septal hypertrophy, followed by [name]), hypertension, high cholesterol and no previous abdominal surgeries who was referred for evaluation of a right inguinal hernia. A few months ago, while playing tennis he had acute pain in the right groin and on further evaluation was noted to have a right inguinal hernia.  Aside from playing tennis once a week he also works out fairly regularly including weightlifting.  He has continued to have this sensation intermittently but less severely, as he has curtailed his tennis and weightlifting activities since the diagnosis. He is retired from [employer], mining.  He does smoke 5 to 6 cigarettes a day... 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  He states he will quit tomorrow.  He is originally from [place].   Review of Systems: A complete review of systems was obtained from the patient.  I have reviewed this information and discussed as appropriate with the patient.  See HPI as well for other ROS.   Medical History: Past Medical History:  Diagnosis Date   Hypertension     There is no problem list on file for this patient.   Past Surgical History:  Procedure Laterality Date   CATARACT EXTRACTION     wrist surgery       No Known Allergies  Current Outpatient Medications on File Prior to Visit  Medication Sig Dispense Refill   fluvastatin (LESCOL) 20 MG capsule fluvastatin 20 mg capsule  TAKE 1 CAPSULE BY MOUTH EVERY DAY     losartan (COZAAR) 100 MG tablet Take 1 tablet by mouth once daily     sildenafiL (VIAGRA) 100 MG tablet TAKE 1 TABLET BY MOUTH 1 HOUR BEFORE SEXUAL ACTIVITY. MAX 1 TABLET DAILY     verapamiL (CALAN-SR) 120 MG  SR tablet Take 1 tablet by mouth at bedtime     No current facility-administered medications on file prior to visit.    Family History  Problem Relation Age of Onset   Diabetes Mother    High blood pressure (Hypertension) Father    Hyperlipidemia (Elevated cholesterol) Father      Social History   Tobacco Use  Smoking Status Every Day   Types: Cigarettes  Smokeless Tobacco Never  Tobacco Comments   4-5 sticks/day     Social History   Socioeconomic History   Marital status: Married  Tobacco Use   Smoking status: Every Day    Types: Cigarettes   Smokeless tobacco: Never   Tobacco comments:    4-5 sticks/day  Vaping Use   Vaping Use: Never used  Substance and Sexual Activity   Alcohol use: Yes    Alcohol/week: 1.0 standard drink of alcohol    Types: 1 Glasses of wine per week   Drug use: Never    Objective:    Vitals:   10/06/22 1540  BP: (!) 146/90  Pulse: 86  Temp: 36.6 C (97.9 F)  SpO2: 98%  Weight: 91.3 kg (201 lb 3.2 oz)  Height: 180.3 cm (5' 11"$ )  PainSc: 0-No pain    Body mass index is 28.06 kg/m.  Gen: A&Ox3, no distress  Unlabored respiration Abdomen is soft, nontender, nondistended.  There is a right inguinal  hernia with vasalva, no hernia on the left.   Assessment and Plan:  Diagnoses and all orders for this visit:  Non-recurrent unilateral inguinal hernia without obstruction or gangrene  We discussed the relevant anatomy and options for repair. I recommend an open approach and we discussed the technique of the procedure.  Discussed risks of bleeding, infection, pain, scarring, injury to structures in the area including nerves, blood vessels, bowel, bladder, risk of chronic pain, hernia recurrence, risk of seroma or hematoma, urinary retention, and risks of general anesthesia including cardiovascular, pulmonary, and thromboembolic complications.  Discussed his risk of complications including hernia recurrence will be increased with ongoing  tobacco abuse-he states he will quit smoking tomorrow.  Questions were answered.  Patient wishes to proceed with scheduling.     Leighann Amadon Raquel James, MD

## 2022-10-07 ENCOUNTER — Telehealth: Payer: Self-pay

## 2022-10-07 NOTE — Telephone Encounter (Signed)
Spoke with the patient and offered virtual appointment; patient declined and requested in an in office appointment this week. Follow up appointment scheduled with Christen Bame, NP.

## 2022-10-07 NOTE — Telephone Encounter (Signed)
   Name: Luke Wheeler  DOB: 11-16-1955  MRN: [id number]  Primary Cardiologist: [name], MD   Preoperative team, please contact this patient and set up a phone call appointment for further preoperative risk assessment. Please obtain consent and complete medication review. Thank you for your help.  I confirm that guidance regarding antiplatelet and oral anticoagulation therapy has been completed and, if necessary, noted below.  No Medications Requested    Mable Fill, Marissa Nestle, NP 10/07/2022, 7:42 AM [place]

## 2022-10-07 NOTE — Telephone Encounter (Signed)
   Pre-operative Risk Assessment    Patient Name: Luke Wheeler  DOB: 21-May-1956 MRN: [id number]      Request for Surgical Clearance    Procedure:   Hernia Repair  Date of Surgery:  Clearance TBD                                 Surgeon:  Jens Som, MD Surgeon's Group or Practice Name:  [facility]  Phone number:  [phone] Karren Cobble, [place] Fax number:  [phone]   Type of Clearance Requested:   - Medical    Type of Anesthesia:  General    Additional requests/questions:   N/A  SignedJamelle Haring   10/07/2022, 7:36 AM

## 2022-10-08 ENCOUNTER — Ambulatory Visit: Payer: 59 | Attending: Nurse Practitioner | Admitting: Nurse Practitioner

## 2022-10-08 ENCOUNTER — Encounter: Payer: Self-pay | Admitting: Nurse Practitioner

## 2022-10-08 VITALS — BP 180/100 | HR 79 | Ht 71.0 in | Wt 200.0 lb

## 2022-10-08 DIAGNOSIS — I2510 Atherosclerotic heart disease of native coronary artery without angina pectoris: Secondary | ICD-10-CM | POA: Diagnosis not present

## 2022-10-08 DIAGNOSIS — R9431 Abnormal electrocardiogram [ECG] [EKG]: Secondary | ICD-10-CM

## 2022-10-08 DIAGNOSIS — I119 Hypertensive heart disease without heart failure: Secondary | ICD-10-CM | POA: Diagnosis not present

## 2022-10-08 DIAGNOSIS — Z01810 Encounter for preprocedural cardiovascular examination: Secondary | ICD-10-CM | POA: Diagnosis not present

## 2022-10-08 DIAGNOSIS — I517 Cardiomegaly: Secondary | ICD-10-CM

## 2022-10-08 MED ORDER — LOSARTAN POTASSIUM 100 MG PO TABS: 100.0000 mg | ORAL_TABLET | Freq: Every day | ORAL | 3 refills | Status: DC

## 2022-10-08 MED ORDER — VERAPAMIL HCL ER 180 MG PO TBCR: 180.0000 mg | EXTENDED_RELEASE_TABLET | Freq: Every day | ORAL | 3 refills | Status: DC

## 2022-10-08 NOTE — Progress Notes (Signed)
Cardiology Office Note:    Date:  10/08/2022   ID:  Luke Wheeler, DOB 06-13-1956, MRN [id number]  PCP:  Shanon Rosser, [employer] Providers Cardiologist:  [name], MD     Referring MD: Shanon Rosser, [name]   Chief Complaint: Preoperative cardiac evaluation  History of Present Illness:    Luke Wheeler is a very pleasant 67 y.o. male with a hx of HTN and asymmetric septal hypertrophy with no evidence of HOCM only, LVEF 56%by cardiac MRI October 2016 and October 2020 with septal thickness 14 mm, LVEF 56%.  Atypical chest pain April 2016 with normal Myoview, EF 63%.  Does not like beta-blocker for HTN due to erectile dysfunction. Quit smoking October 2021.  Lung cancer CT 07/26/2021 benign.  Lipids have been followed by PCP.  He is retired, staying active Hydrologist. Wife works for division of [employer].   Last cardiology clinic visit was 04/22/2022 with [name].  ECG revealed inferior lateral biphasic T waves likely related to HTN/LVH. He reported some exertional chest discomfort, not tightness.  He underwent coronary CTA which revealed coronary calcium score 10.5 (47 percentile) with mild CAD, 25 to 49% stenosis ostial and mid distal RCA, proximal PLA, ostial RI.  Minimal stenosis less than 25% mid LAD, proximal LCx.   Today, he is here for preoperative cardiac evaluation for upcoming hernia repair. He was rushing to get here and BP is quite elevated. Reports it was 140/90 mmHg at recent office visit with surgeon. Occasional palpitations that do not occur very often, most often when he is relaxed. Has shortness of breath only when playing long tennis matches. Had to stop tennis due to inguinal hernia. Admits that at times, he runs out of his medication. Returned from recent 3 week trip and ran out of medication for about one week. Does not limit dietary intake of sodium. He denies chest pain, lower extremity edema, fatigue, palpitations, melena, hematuria,  hemoptysis, diaphoresis, weakness, presyncope, syncope, orthopnea, and PND.   Past Medical History:  Diagnosis Date   Cataract    Chest pain    High cholesterol    Hx of cardiovascular stress test    ETT-Myoview 4/16:  No ischemia, EF 50%, normal wall motion, normal study   Hx of echocardiogram    Echo 11/15:  mod asymmetric septal hypertrophy, no LVOT gradient, EF 55%, no RWMA, Gr 1 DD, septal 16 mm, post wall 11.2 mm, no SAM, trivial MR, normal RVF, mild RAE (HCM variant vs LVH)   Hypertension    LVH (left ventricular hypertrophy)    non-obstructive HCM vs LVH on echo 06/2014   Snoring     Past Surgical History:  Procedure Laterality Date   CATARACT EXTRACTION, BILATERAL     WISDOM TOOTH EXTRACTION      Current Medications: Current Meds  Medication Sig   aspirin EC 81 MG EC tablet Take 1 tablet (81 mg total) by mouth daily.   fluvastatin (LESCOL) 20 MG capsule Take 1 capsule (20 mg total) by mouth daily.   sildenafil (VIAGRA) 100 MG tablet Take 1 tablet (100 mg total) by mouth daily as needed for erectile dysfunction.   [DISCONTINUED] losartan (COZAAR) 100 MG tablet Take 1 tablet by mouth daily.   [DISCONTINUED] verapamil (CALAN-SR) 120 MG CR tablet Take 1 tablet (120 mg total) by mouth at bedtime.   Current Facility-Administered Medications for the 10/08/22 encounter (Office Visit) with Emmaline Life, NP  Medication   0.9 %  sodium  chloride infusion     Allergies:   Patient has no known allergies.   Social History   Socioeconomic History   Marital status: Married    Spouse name: Not on file   Number of children: 4   Years of education: Not on file   Highest education level: Not on file  Occupational History   Not on file  Tobacco Use   Smoking status: Every Day    Types: Cigarettes   Smokeless tobacco: Never  Vaping Use   Vaping Use: Never used  Substance and Sexual Activity   Alcohol use: Yes    Comment: twice monthly   Drug use: No   Sexual  activity: Yes  Other Topics Concern   Not on file  Social History Narrative   Not on file   Social Determinants of Health   Financial Resource Strain: Not on file  Food Insecurity: Not on file  Transportation Needs: Not on file  Physical Activity: Not on file  Stress: Not on file  Social Connections: Not on file     Family History: The patient's family history includes Coronary artery disease in his father; Diabetes in his mother; Hyperlipidemia in his father; Stroke in his father. There is no history of Colon cancer, Esophageal cancer, Rectal cancer, or Stomach cancer.  ROS:   Please see the history of present illness.  All other systems reviewed and are negative.  Labs/Other Studies Reviewed:    The following studies were reviewed today:  Coronary CTA 05/11/22 IMPRESSION: 1. Mild CAD, 25-49% stenosis, CADRADS 2.   2. Coronary calcium score is 10.5, which places the patient in the 47th percentile for age and sex matched control.   3. Normal coronary origins with right dominance  Cardiac MR 06/02/19 1. Asymmetric hypertrophy measuring up to 36m in basal septum and lateral walls (734minferior wall), not meeting criteria for hypertrophic cardiomyopathy (<15 mm)   2. RV insertion site LGE, which can be seen in setting of elevated pulmonary pressures   3.  Normal LV size and systolic function (EF 560000000  4.  Normal RV size and systolic function (EF [id number] Stress Test 12/04/14 Impression Exercise Capacity:  Good exercise capacity. BP Response:  Normal blood pressure response. Clinical Symptoms:  No significant symptoms noted. ECG Impression:  No significant ST segment change suggestive of ischemia. Comparison with Prior Nuclear Study: No previous nuclear study performed   Overall Impression:  Normal stress nuclear study.   LV Wall Motion: Low normal LV Function, EF 50%; NL Wall Motion   Recent Labs: 04/22/2022: BUN 12; Creatinine, Ser 1.34; Potassium 4.7; Sodium 141   Recent Lipid Panel No results found for: "CHOL", "TRIG", "HDL", "CHOLHDL", "VLDL", "LDLCALC", "LDLDIRECT"   Risk Assessment/Calculations:       Physical Exam:    VS:  BP (!) 180/100   Pulse 79   Ht 5' 11"$  (1.803 m)   Wt 200 lb (90.7 kg)   SpO2 97%   BMI 27.89 kg/m     Wt Readings from Last 3 Encounters:  10/08/22 200 lb (90.7 kg)  04/22/22 200 lb (90.7 kg)  07/14/21 207 lb (93.9 kg)     GEN:  Well nourished, well developed in no acute distress HEENT: Normal NECK: No JVD; No carotid bruits CARDIAC: RRR, no murmurs, rubs, gallops RESPIRATORY:  Clear to auscultation without rales, wheezing or rhonchi  ABDOMEN: Soft, non-tender, non-distended MUSCULOSKELETAL:  No edema; No deformity. 2+ pedal pulses, equal bilaterally SKIN: Warm and dry  NEUROLOGIC:  Alert and oriented x 3 PSYCHIATRIC:  Normal affect   EKG:  EKG is ordered today.  The ekg ordered today demonstrates normal sinus rhythm at 79 bpm, minimal voltage criteria for LVH, T WI V4 through 6, II, III, aVF, no acute change from previous tracing  HYPERTENSION CONTROL Vitals:   10/08/22 1517 10/08/22 1634  BP: (!) 182/120 (!) 180/100    The patient's blood pressure is elevated above target today.  In order to address the patient's elevated BP: A current anti-hypertensive medication was adjusted today.     Diagnoses:    1. LVH (left ventricular hypertrophy)   2. Hypertensive heart disease without heart failure   3. Preoperative cardiovascular examination   4. Abnormal electrocardiogram (ECG) (EKG)   5. Coronary artery disease involving native coronary artery of native heart without angina pectoris    Assessment and Plan:     Preoperative cardiac evaluation: The patient is doing well from a cardiac perspective. Therefore, based on ACC/AHA guidelines, the patient would be at acceptable risk for the planned procedure without further cardiovascular testing. According to the Revised Cardiac Risk Index (RCRI), his  Perioperative Risk of Major Cardiac Event is (%): 0.4. His Functional Capacity in METs is: 8.23 according to the Duke Activity Status Index (DASI).  He may proceed with surgery without further cardiac testing. As noted below, anti-hypertensive medication adjustment made today.   Hypertensive heart disease: BP quite elevated today. He rushed to get here today. Admits he runs out of medication at times because he only has #30 ordered and does not have enough when going on long trips. Will increase verapamil to 180 mg today. Continue losartan 100 mg daily.  Will give him #90 (I am not aware of any reason insurance would not approve) and have him return for follow-up after his hernia surgery. Will get BMP today to help guide further therapy.  Advised him to monitor at home and notify us if consistently > 140/80.   LVH: Cardiac MRI with no gadolinium uptake in 2016.  Septal thickness only 14 mm on MRI 06/02/2019 and again no gadolinium uptake other than RV insertion site.  He denies dyspnea, chest pain, orthopnea, PND, presyncope, syncope. Will continue to monitor clinically for now. He needs to get his BP under better control.  Lengthy discussion about better diet, avoiding sodium and processed foods, and medication compliance.   Abnormal ECG: Continues to have criteria for LVH on EKG as well as nonspecific TWI. No acute change from previous.  CAD without angina: Coronary CTA 04/2022 with coronary calcium score 10.5 (47th %), mild CAD mid-distal RCA (25-49%), ostial RI, <25% mid LAD and LCx. He denies chest pain, dyspnea, or other symptoms concerning for angina.  No indication for further ischemic evaluation at this time. Need to update lipids at next office visit.   Disposition: 2 - 3 months with [name] or me  Medication Adjustments/Labs and Tests Ordered: Current medicines are reviewed at length with the patient today.  Concerns regarding medicines are outlined above.  Orders Placed This Encounter   Procedures   Basic Metabolic Panel (BMET)   EKG 12-Lead   Meds ordered this encounter  Medications   verapamil (CALAN-SR) 180 MG CR tablet    Sig: Take 1 tablet (180 mg total) by mouth at bedtime.    Dispense:  90 tablet    Refill:  3   losartan (COZAAR) 100 MG tablet    Sig: Take 1 tablet (100 mg total) by  mouth daily.    Dispense:  90 tablet    Refill:  3    Patient Instructions  Medication Instructions:   INCREASE Verapamil one (1) tablet by mouth ( 180 mg ) at bedtime.    *If you need a refill on your cardiac medications before your next appointment, please call your pharmacy*   Lab Work:  TODAY!!!!! BMET  If you have labs (blood work) drawn today and your tests are completely normal, you will receive your results only by: [place] (if you have MyChart) OR A paper copy in the mail If you have any lab test that is abnormal or we need to change your treatment, we will call you to review the results.   Testing/Procedures:  None ordered.   Follow-Up: At [facility], you and your health needs are our priority.  As part of our continuing mission to provide you with exceptional heart care, we have created designated Provider Care Teams.  These Care Teams include your primary Cardiologist (physician) and Advanced Practice Providers (APPs -  Physician Assistants and Nurse Practitioners) who all work together to provide you with the care you need, when you need it.  We recommend signing up for the patient portal called "MyChart".  Sign up information is provided on this After Visit Summary.  MyChart is used to connect with patients for Virtual Visits (Telemedicine).  Patients are able to view lab/test results, encounter notes, upcoming appointments, etc.  Non-urgent messages can be sent to your provider as well.   To learn more about what you can do with MyChart, go to [email or web address].    Your next appointment:   2 month(s)  Provider:   Christen Bame, NP         Other Instructions  DASH Eating Plan DASH stands for Dietary Approaches to Stop Hypertension. The DASH eating plan is a healthy eating plan that has been shown to: Reduce high blood pressure (hypertension). Reduce your risk for type 2 diabetes, heart disease, and stroke. Help with weight loss. What are tips for following this plan? Reading food labels Check food labels for the amount of salt (sodium) per serving. Choose foods with less than 5 percent of the Daily Value of sodium. Generally, foods with less than 300 milligrams (mg) of sodium per serving fit into this eating plan. To find whole grains, look for the word "whole" as the first word in the ingredient list. Shopping Buy products labeled as "low-sodium" or "no salt added." Buy fresh foods. Avoid canned foods and pre-made or frozen meals. Cooking Avoid adding salt when cooking. Use salt-free seasonings or herbs instead of table salt or sea salt. Check with your health care provider or pharmacist before using salt substitutes. Do not fry foods. Cook foods using healthy methods such as baking, boiling, grilling, roasting, and broiling instead. Cook with heart-healthy oils, such as olive, canola, avocado, soybean, or sunflower oil. Meal planning  Eat a balanced diet that includes: 4 or more servings of fruits and 4 or more servings of vegetables each day. Try to fill one-half of your plate with fruits and vegetables. 6-8 servings of whole grains each day. Less than 6 oz (170 g) of lean meat, poultry, or fish each day. A 3-oz (85-g) serving of meat is about the same size as a deck of cards. One egg equals 1 oz (28 g). 2-3 servings of low-fat dairy each day. One serving is 1 cup (237 mL). 1 serving of nuts, seeds, or beans  5 times each week. 2-3 servings of heart-healthy fats. Healthy fats called omega-3 fatty acids are found in foods such as walnuts, flaxseeds, fortified milks, and eggs. These fats are also found  in cold-water fish, such as sardines, salmon, and mackerel. Limit how much you eat of: Canned or prepackaged foods. Food that is high in trans fat, such as some fried foods. Food that is high in saturated fat, such as fatty meat. Desserts and other sweets, sugary drinks, and other foods with added sugar. Full-fat dairy products. Do not salt foods before eating. Do not eat more than 4 egg yolks a week. Try to eat at least 2 vegetarian meals a week. Eat more home-cooked food and less restaurant, buffet, and fast food. Lifestyle When eating at a restaurant, ask that your food be prepared with less salt or no salt, if possible. If you drink alcohol: Limit how much you use to: 0-1 drink a day for women who are not pregnant. 0-2 drinks a day for men. Be aware of how much alcohol is in your drink. In the [place] one drink equals one 12 oz bottle of beer (355 mL), one 5 oz glass of wine (148 mL), or one 1 oz glass of hard liquor (44 mL). General information Avoid eating more than 2,300 mg of salt a day. If you have hypertension, you may need to reduce your sodium intake to 1,500 mg a day. Work with your health care provider to maintain a healthy body weight or to lose weight. Ask what an ideal weight is for you. Get at least 30 minutes of exercise that causes your heart to beat faster (aerobic exercise) most days of the week. Activities may include walking, swimming, or biking. Work with your health care provider or dietitian to adjust your eating plan to your individual calorie needs. What foods should I eat? Fruits All fresh, dried, or frozen fruit. Canned fruit in natural juice (without added sugar). Vegetables Fresh or frozen vegetables (raw, steamed, roasted, or grilled). Low-sodium or reduced-sodium tomato and vegetable juice. Low-sodium or reduced-sodium tomato sauce and tomato paste. Low-sodium or reduced-sodium canned vegetables. Grains Whole-grain or whole-wheat bread. Whole-grain or  whole-wheat pasta. Brown rice. [place]. Bulgur. Whole-grain and low-sodium cereals. Pita bread. Low-fat, low-sodium crackers. Whole-wheat flour tortillas. Meats and other proteins Skinless chicken or [place]. Ground chicken or [place]. Pork with fat trimmed off. Fish and seafood. Egg whites. Dried beans, peas, or lentils. Unsalted nuts, nut butters, and seeds. Unsalted canned beans. Lean cuts of beef with fat trimmed off. Low-sodium, lean precooked or cured meat, such as sausages or meat loaves. Dairy Low-fat (1%) or fat-free (skim) milk. Reduced-fat, low-fat, or fat-free cheeses. Nonfat, low-sodium ricotta or cottage cheese. Low-fat or nonfat yogurt. Low-fat, low-sodium cheese. Fats and oils Soft margarine without trans fats. Vegetable oil. Reduced-fat, low-fat, or light mayonnaise and salad dressings (reduced-sodium). Canola, safflower, olive, avocado, soybean, and sunflower oils. Avocado. Seasonings and condiments Herbs. Spices. Seasoning mixes without salt. Other foods Unsalted popcorn and pretzels. Fat-free sweets. The items listed above may not be a complete list of foods and beverages you can eat. Contact a dietitian for more information. What foods should I avoid? Fruits Canned fruit in a light or heavy syrup. Fried fruit. Fruit in cream or butter sauce. Vegetables Creamed or fried vegetables. Vegetables in a cheese sauce. Regular canned vegetables (not low-sodium or reduced-sodium). Regular canned tomato sauce and paste (not low-sodium or reduced-sodium). Regular tomato and vegetable juice (not low-sodium or reduced-sodium). Angie Fava. Olives.  Grains Baked goods made with fat, such as croissants, muffins, or some breads. Dry pasta or rice meal packs. Meats and other proteins Fatty cuts of meat. Ribs. Fried meat. Berniece Salines. Bologna, salami, and other precooked or cured meats, such as sausages or meat loaves. Fat from the back of a pig (fatback). Bratwurst. Salted nuts and seeds. Canned  beans with added salt. Canned or smoked fish. Whole eggs or egg yolks. Chicken or [place] with skin. Dairy Whole or 2% milk, cream, and half-and-half. Whole or full-fat cream cheese. Whole-fat or sweetened yogurt. Full-fat cheese. Nondairy creamers. Whipped toppings. Processed cheese and cheese spreads. Fats and oils Butter. Stick margarine. Lard. Shortening. Ghee. Bacon fat. Tropical oils, such as coconut, palm kernel, or palm oil. Seasonings and condiments Onion salt, garlic salt, seasoned salt, table salt, and sea salt. Worcestershire sauce. Tartar sauce. Barbecue sauce. Teriyaki sauce. Soy sauce, including reduced-sodium. Steak sauce. Canned and packaged gravies. Fish sauce. Oyster sauce. Cocktail sauce. Store-bought horseradish. Ketchup. Mustard. Meat flavorings and tenderizers. Bouillon cubes. Hot sauces. Pre-made or packaged marinades. Pre-made or packaged taco seasonings. Relishes. Regular salad dressings. Other foods Salted popcorn and pretzels. The items listed above may not be a complete list of foods and beverages you should avoid. Contact a dietitian for more information. Where to find more information National Heart, Lung, and Blood Institute: https://wilson-eaton.com/ American Heart Association: www.heart.org Academy of Nutrition and Dietetics: www.eatright.Accident: www.kidney.org Summary The DASH eating plan is a healthy eating plan that has been shown to reduce high blood pressure (hypertension). It may also reduce your risk for type 2 diabetes, heart disease, and stroke. When on the DASH eating plan, aim to eat more fresh fruits and vegetables, whole grains, lean proteins, low-fat dairy, and heart-healthy fats. With the DASH eating plan, you should limit salt (sodium) intake to 2,300 mg a day. If you have hypertension, you may need to reduce your sodium intake to 1,500 mg a day. Work with your health care provider or dietitian to adjust your eating plan to your  individual calorie needs. This information is not intended to replace advice given to you by your health care provider. Make sure you discuss any questions you have with your health care provider. Document Revised: 07/14/2019 Document Reviewed: 07/14/2019 Elsevier Patient Education  2023 [place], Emmaline Life, NP  10/08/2022 4:51 PM    [place]

## 2022-10-08 NOTE — Patient Instructions (Signed)
Medication Instructions:   INCREASE Verapamil one (1) tablet by mouth ( 180 mg ) at bedtime.    *If you need a refill on your cardiac medications before your next appointment, please call your pharmacy*   Lab Work:  TODAY!!!!! BMET  If you have labs (blood work) drawn today and your tests are completely normal, you will receive your results only by: [place] (if you have MyChart) OR A paper copy in the mail If you have any lab test that is abnormal or we need to change your treatment, we will call you to review the results.   Testing/Procedures:  None ordered.   Follow-Up: At [facility], you and your health needs are our priority.  As part of our continuing mission to provide you with exceptional heart care, we have created designated Provider Care Teams.  These Care Teams include your primary Cardiologist (physician) and Advanced Practice Providers (APPs -  Physician Assistants and Nurse Practitioners) who all work together to provide you with the care you need, when you need it.  We recommend signing up for the patient portal called "MyChart".  Sign up information is provided on this After Visit Summary.  MyChart is used to connect with patients for Virtual Visits (Telemedicine).  Patients are able to view lab/test results, encounter notes, upcoming appointments, etc.  Non-urgent messages can be sent to your provider as well.   To learn more about what you can do with MyChart, go to [email or web address].    Your next appointment:   2 month(s)  Provider:   Christen Bame, NP         Other Instructions  DASH Eating Plan DASH stands for Dietary Approaches to Stop Hypertension. The DASH eating plan is a healthy eating plan that has been shown to: Reduce high blood pressure (hypertension). Reduce your risk for type 2 diabetes, heart disease, and stroke. Help with weight loss. What are tips for following this plan? Reading food labels Check food labels  for the amount of salt (sodium) per serving. Choose foods with less than 5 percent of the Daily Value of sodium. Generally, foods with less than 300 milligrams (mg) of sodium per serving fit into this eating plan. To find whole grains, look for the word "whole" as the first word in the ingredient list. Shopping Buy products labeled as "low-sodium" or "no salt added." Buy fresh foods. Avoid canned foods and pre-made or frozen meals. Cooking Avoid adding salt when cooking. Use salt-free seasonings or herbs instead of table salt or sea salt. Check with your health care provider or pharmacist before using salt substitutes. Do not fry foods. Cook foods using healthy methods such as baking, boiling, grilling, roasting, and broiling instead. Cook with heart-healthy oils, such as olive, canola, avocado, soybean, or sunflower oil. Meal planning  Eat a balanced diet that includes: 4 or more servings of fruits and 4 or more servings of vegetables each day. Try to fill one-half of your plate with fruits and vegetables. 6-8 servings of whole grains each day. Less than 6 oz (170 g) of lean meat, poultry, or fish each day. A 3-oz (85-g) serving of meat is about the same size as a deck of cards. One egg equals 1 oz (28 g). 2-3 servings of low-fat dairy each day. One serving is 1 cup (237 mL). 1 serving of nuts, seeds, or beans 5 times each week. 2-3 servings of heart-healthy fats. Healthy fats called omega-3 fatty acids are found in foods such as  walnuts, flaxseeds, fortified milks, and eggs. These fats are also found in cold-water fish, such as sardines, salmon, and mackerel. Limit how much you eat of: Canned or prepackaged foods. Food that is high in trans fat, such as some fried foods. Food that is high in saturated fat, such as fatty meat. Desserts and other sweets, sugary drinks, and other foods with added sugar. Full-fat dairy products. Do not salt foods before eating. Do not eat more than 4 egg yolks  a week. Try to eat at least 2 vegetarian meals a week. Eat more home-cooked food and less restaurant, buffet, and fast food. Lifestyle When eating at a restaurant, ask that your food be prepared with less salt or no salt, if possible. If you drink alcohol: Limit how much you use to: 0-1 drink a day for women who are not pregnant. 0-2 drinks a day for men. Be aware of how much alcohol is in your drink. In the [place] one drink equals one 12 oz bottle of beer (355 mL), one 5 oz glass of wine (148 mL), or one 1 oz glass of hard liquor (44 mL). General information Avoid eating more than 2,300 mg of salt a day. If you have hypertension, you may need to reduce your sodium intake to 1,500 mg a day. Work with your health care provider to maintain a healthy body weight or to lose weight. Ask what an ideal weight is for you. Get at least 30 minutes of exercise that causes your heart to beat faster (aerobic exercise) most days of the week. Activities may include walking, swimming, or biking. Work with your health care provider or dietitian to adjust your eating plan to your individual calorie needs. What foods should I eat? Fruits All fresh, dried, or frozen fruit. Canned fruit in natural juice (without added sugar). Vegetables Fresh or frozen vegetables (raw, steamed, roasted, or grilled). Low-sodium or reduced-sodium tomato and vegetable juice. Low-sodium or reduced-sodium tomato sauce and tomato paste. Low-sodium or reduced-sodium canned vegetables. Grains Whole-grain or whole-wheat bread. Whole-grain or whole-wheat pasta. Brown rice. [place]. Bulgur. Whole-grain and low-sodium cereals. Pita bread. Low-fat, low-sodium crackers. Whole-wheat flour tortillas. Meats and other proteins Skinless chicken or [place]. Ground chicken or [place]. Pork with fat trimmed off. Fish and seafood. Egg whites. Dried beans, peas, or lentils. Unsalted nuts, nut butters, and seeds. Unsalted canned beans. Lean cuts of  beef with fat trimmed off. Low-sodium, lean precooked or cured meat, such as sausages or meat loaves. Dairy Low-fat (1%) or fat-free (skim) milk. Reduced-fat, low-fat, or fat-free cheeses. Nonfat, low-sodium ricotta or cottage cheese. Low-fat or nonfat yogurt. Low-fat, low-sodium cheese. Fats and oils Soft margarine without trans fats. Vegetable oil. Reduced-fat, low-fat, or light mayonnaise and salad dressings (reduced-sodium). Canola, safflower, olive, avocado, soybean, and sunflower oils. Avocado. Seasonings and condiments Herbs. Spices. Seasoning mixes without salt. Other foods Unsalted popcorn and pretzels. Fat-free sweets. The items listed above may not be a complete list of foods and beverages you can eat. Contact a dietitian for more information. What foods should I avoid? Fruits Canned fruit in a light or heavy syrup. Fried fruit. Fruit in cream or butter sauce. Vegetables Creamed or fried vegetables. Vegetables in a cheese sauce. Regular canned vegetables (not low-sodium or reduced-sodium). Regular canned tomato sauce and paste (not low-sodium or reduced-sodium). Regular tomato and vegetable juice (not low-sodium or reduced-sodium). Angie Fava. Olives. Grains Baked goods made with fat, such as croissants, muffins, or some breads. Dry pasta or rice meal packs. Meats and  other proteins Fatty cuts of meat. Ribs. Fried meat. Berniece Salines. Bologna, salami, and other precooked or cured meats, such as sausages or meat loaves. Fat from the back of a pig (fatback). Bratwurst. Salted nuts and seeds. Canned beans with added salt. Canned or smoked fish. Whole eggs or egg yolks. Chicken or [place] with skin. Dairy Whole or 2% milk, cream, and half-and-half. Whole or full-fat cream cheese. Whole-fat or sweetened yogurt. Full-fat cheese. Nondairy creamers. Whipped toppings. Processed cheese and cheese spreads. Fats and oils Butter. Stick margarine. Lard. Shortening. Ghee. Bacon fat. Tropical oils, such as  coconut, palm kernel, or palm oil. Seasonings and condiments Onion salt, garlic salt, seasoned salt, table salt, and sea salt. Worcestershire sauce. Tartar sauce. Barbecue sauce. Teriyaki sauce. Soy sauce, including reduced-sodium. Steak sauce. Canned and packaged gravies. Fish sauce. Oyster sauce. Cocktail sauce. Store-bought horseradish. Ketchup. Mustard. Meat flavorings and tenderizers. Bouillon cubes. Hot sauces. Pre-made or packaged marinades. Pre-made or packaged taco seasonings. Relishes. Regular salad dressings. Other foods Salted popcorn and pretzels. The items listed above may not be a complete list of foods and beverages you should avoid. Contact a dietitian for more information. Where to find more information National Heart, Lung, and Blood Institute: https://wilson-eaton.com/ American Heart Association: www.heart.org Academy of Nutrition and Dietetics: www.eatright.[place]: www.kidney.org Summary The DASH eating plan is a healthy eating plan that has been shown to reduce high blood pressure (hypertension). It may also reduce your risk for type 2 diabetes, heart disease, and stroke. When on the DASH eating plan, aim to eat more fresh fruits and vegetables, whole grains, lean proteins, low-fat dairy, and heart-healthy fats. With the DASH eating plan, you should limit salt (sodium) intake to 2,300 mg a day. If you have hypertension, you may need to reduce your sodium intake to 1,500 mg a day. Work with your health care provider or dietitian to adjust your eating plan to your individual calorie needs. This information is not intended to replace advice given to you by your health care provider. Make sure you discuss any questions you have with your health care provider. Document Revised: 07/14/2019 Document Reviewed: 07/14/2019 Elsevier Patient Education  [place].

## 2022-10-09 LAB — BASIC METABOLIC PANEL
BUN/Creatinine Ratio: 11 (ref 10–24)
BUN: 12 mg/dL (ref 8–27)
CO2: 23 mmol/L (ref 20–29)
Calcium: 10.6 mg/dL — ABNORMAL HIGH (ref 8.6–10.2)
Chloride: 105 mmol/L (ref 96–106)
Creatinine, Ser: 1.1 mg/dL (ref 0.76–1.27)
Glucose: 77 mg/dL (ref 70–99)
Potassium: 4.5 mmol/L (ref 3.5–5.2)
Sodium: 145 mmol/L — ABNORMAL HIGH (ref 134–144)
eGFR: 74 mL/min/{1.73_m2} (ref >59)

## 2022-10-21 NOTE — Patient Instructions (Signed)
SURGICAL WAITING ROOM VISITATION  Patients having surgery or a procedure may have no more than 2 support people in the waiting area - these visitors may rotate.    Children under the age of 36 must have an adult with them who is not the patient.  Due to an increase in RSV and influenza rates and associated hospitalizations, children ages 72 and under may not visit patients in [place].  If the patient needs to stay at the hospital during part of their recovery, the visitor guidelines for inpatient rooms apply. Pre-op nurse will coordinate an appropriate time for 1 support person to accompany patient in pre-op.  This support person may not rotate.    Please refer to the [facility] website for the visitor guidelines for Inpatients (after your surgery is over and you are in a regular room).       Your procedure is scheduled on:  10/27/22    Report to [facility] Main Entrance    Report to admitting at   1000 AM   Call this number if you have problems the morning of surgery [phone]   Do not eat food  or drink liquids :After Midnight.                          If you have questions, please contact your surgeon's office.   Oral Hygiene is also important to reduce your risk of infection.                                    Remember - BRUSH YOUR TEETH THE MORNING OF SURGERY WITH YOUR REGULAR TOOTHPASTE  DENTURES WILL BE REMOVED PRIOR TO SURGERY PLEASE DO NOT APPLY "Poly grip" OR ADHESIVES!!!   Do NOT smoke after Midnight   Take these medicines the morning of surgery with A SIP OF WATER:  none   DO NOT TAKE ANY ORAL DIABETIC MEDICATIONS DAY OF YOUR SURGERY  Bring CPAP mask and tubing day of surgery.                              You may not have any metal on your body including hair pins, jewelry, and body piercing             Do not wear make-up, lotions, powders, perfumes/cologne, or deodorant  Do not wear nail polish including gel and S&S,  artificial/acrylic nails, or any other type of covering on natural nails including finger and toenails. If you have artificial nails, gel coating, etc. that needs to be removed by a nail salon please have this removed prior to surgery or surgery may need to be canceled/ delayed if the surgeon/ anesthesia feels like they are unable to be safely monitored.   Do not shave  48 hours prior to surgery.               Men may shave face and neck.   Do not bring valuables to the hospital. [place].   Contacts, glasses, dentures or bridgework may not be worn into surgery.   Bring small overnight bag day of surgery.   DO NOT [place]. PHARMACY WILL DISPENSE MEDICATIONS LISTED ON YOUR  MEDICATION LIST TO YOU DURING YOUR ADMISSION [place]!    Patients discharged on the day of surgery will not be allowed to drive home.  Someone NEEDS to stay with you for the first 24 hours after anesthesia.   Special Instructions: Bring a copy of your healthcare power of attorney and living will documents the day of surgery if you haven't scanned them before.              Please read over the following fact sheets you were given: IF [place] [phone]   If you received a COVID test during your pre-op visit  it is requested that you wear a mask when out in public, stay away from anyone that may not be feeling well and notify your surgeon if you develop symptoms. If you test positive for Covid or have been in contact with anyone that has tested positive in the last 10 days please notify you surgeon.    [place] - Preparing for Surgery Before surgery, you can play an important role.  Because skin is not sterile, your skin needs to be as free of germs as possible.  You can reduce the number of germs on your skin by washing with CHG (chlorahexidine gluconate) soap before  surgery.  CHG is an antiseptic cleaner which kills germs and bonds with the skin to continue killing germs even after washing. Please DO NOT use if you have an allergy to CHG or antibacterial soaps.  If your skin becomes reddened/irritated stop using the CHG and inform your nurse when you arrive at Short Stay. Do not shave (including legs and underarms) for at least 48 hours prior to the first CHG shower.  You may shave your face/neck. Please follow these instructions carefully:  1.  Shower with CHG Soap the night before surgery and the  morning of Surgery.  2.  If you choose to wash your hair, wash your hair first as usual with your  normal  shampoo.  3.  After you shampoo, rinse your hair and body thoroughly to remove the  shampoo.                           4.  Use CHG as you would any other liquid soap.  You can apply chg directly  to the skin and wash                       Gently with a scrungie or clean washcloth.  5.  Apply the CHG Soap to your body ONLY FROM THE NECK DOWN.   Do not use on face/ open                           Wound or open sores. Avoid contact with eyes, ears mouth and genitals (private parts).                       Wash face,  Genitals (private parts) with your normal soap.             6.  Wash thoroughly, paying special attention to the area where your surgery  will be performed.  7.  Thoroughly rinse your body with warm water from the neck down.  8.  DO NOT shower/wash with your normal soap after using and rinsing off  the CHG  Soap.                9.  Pat yourself dry with a clean towel.            10.  Wear clean pajamas.            11.  Place clean sheets on your bed the night of your first shower and do not  sleep with pets. Day of Surgery : Do not apply any lotions/deodorants the morning of surgery.  Please wear clean clothes to the hospital/surgery center.  FAILURE TO FOLLOW THESE INSTRUCTIONS MAY RESULT IN THE CANCELLATION OF YOUR SURGERY PATIENT  SIGNATURE_________________________________  NURSE SIGNATURE__________________________________  ________________________________________________________________________

## 2022-10-21 NOTE — Progress Notes (Addendum)
Anesthesia Review:  PCP: scott Long,PAc  Cardiologist : Sammuel Cooper, NP LOV 10/08/22  Chest x-ray : 10/26/22  CT chest- 07/26/21  EKG : 10/08/22  CT Cors- 05/11/22  Echo : Stress test: 2016  Cardiac Cath :  Activity level: can do a flgiht of stairs without difficulty  Sleep Study/ CPAP : none  Fasting Blood Sugar :      / Checks Blood Sugar -- times a day:   Blood Thinner/ Instructions /Last Dose: ASA / Instructions/ Last Dose :    10/08/22- BMP  and 10/26/22  Blood pressure at preop was 174/99 in right arm and in left arm was 163/99.  PT denies any chest pain, shortness of breath, blurred vision or headache.  PT states he did have some palpitations this am which has off and on.  Shawn Stall made aware.  No new orders given.

## 2022-10-26 ENCOUNTER — Encounter (HOSPITAL_COMMUNITY)
Admission: RE | Admit: 2022-10-26 | Discharge: 2022-10-26 | Disposition: A | Discharge status: Home / Self Care | Payer: Medicare Other | Source: Ambulatory Visit | Attending: Surgery | Admitting: Surgery

## 2022-10-26 ENCOUNTER — Other Ambulatory Visit: Payer: Self-pay

## 2022-10-26 ENCOUNTER — Ambulatory Visit (HOSPITAL_COMMUNITY)
Admission: RE | Admit: 2022-10-26 | Discharge: 2022-10-26 | Disposition: A | Discharge status: Home / Self Care | Payer: 59 | Source: Ambulatory Visit | Attending: Surgery | Admitting: Surgery

## 2022-10-26 ENCOUNTER — Encounter (HOSPITAL_COMMUNITY): Payer: Self-pay

## 2022-10-26 VITALS — BP 174/99 | HR 58 | Temp 97.9°F | Resp 16 | Ht 71.0 in | Wt 196.0 lb

## 2022-10-26 DIAGNOSIS — Z01812 Encounter for preprocedural laboratory examination: Secondary | ICD-10-CM | POA: Diagnosis present

## 2022-10-26 DIAGNOSIS — I10 Essential (primary) hypertension: Secondary | ICD-10-CM | POA: Diagnosis not present

## 2022-10-26 DIAGNOSIS — K4090 Unilateral inguinal hernia, without obstruction or gangrene, not specified as recurrent: Secondary | ICD-10-CM | POA: Diagnosis not present

## 2022-10-26 DIAGNOSIS — Z720 Tobacco use: Secondary | ICD-10-CM

## 2022-10-26 DIAGNOSIS — Z01818 Encounter for other preprocedural examination: Secondary | ICD-10-CM

## 2022-10-26 LAB — BASIC METABOLIC PANEL
Anion gap: 11 (ref 5–15)
BUN: 16 mg/dL (ref 8–23)
CO2: 24 mmol/L (ref 22–32)
Calcium: 9.8 mg/dL (ref 8.9–10.3)
Chloride: 103 mmol/L (ref 98–111)
Creatinine, Ser: 1.05 mg/dL (ref 0.61–1.24)
GFR, Estimated: >60 mL/min (ref >60)
Glucose, Bld: 99 mg/dL (ref 70–99)
Potassium: 3.9 mmol/L (ref 3.5–5.1)
Sodium: 138 mmol/L (ref 135–145)

## 2022-10-26 LAB — CBC
HCT: 43.7 % (ref 39.0–52.0)
Hemoglobin: 13.8 g/dL (ref 13.0–17.0)
MCH: 28.6 pg (ref 26.0–34.0)
MCHC: 31.6 g/dL (ref 30.0–36.0)
MCV: 90.5 fL (ref 80.0–100.0)
Platelets: 273 10*3/uL (ref 150–400)
RBC: 4.83 MIL/uL (ref 4.22–5.81)
RDW: 12 % (ref 11.5–15.5)
WBC: 9.9 10*3/uL (ref 4.0–10.5)
nRBC: 0 % (ref 0.0–0.2)

## 2022-10-26 NOTE — Anesthesia Preprocedure Evaluation (Signed)
Anesthesia Evaluation  Patient identified by MRN, date of birth, ID band Patient awake    Reviewed: Allergy & Precautions, NPO status , Patient's Chart, lab work & pertinent test results  Airway Mallampati: IV  TM Distance: >3 FB Neck ROM: Full    Dental  (+) Dental Advisory Given   Pulmonary Current Smoker   breath sounds clear to auscultation       Cardiovascular Exercise Tolerance: Good hypertension, Pt. on medications  Rhythm:Regular Rate:Normal     Neuro/Psych negative neurological ROS     GI/Hepatic negative GI ROS, Neg liver ROS,,,  Endo/Other  negative endocrine ROS    Renal/GU negative Renal ROS     Musculoskeletal   Abdominal   Peds  Hematology negative hematology ROS (+)   Anesthesia Other Findings   Reproductive/Obstetrics                             Anesthesia Physical Anesthesia Plan  ASA: 2  Anesthesia Plan: General   Post-op Pain Management: Tylenol PO (pre-op)*, Gabapentin PO (pre-op)* and Toradol IV (intra-op)*   Induction: Intravenous  PONV Risk Score and Plan: 1 and Dexamethasone, Ondansetron and Treatment may vary due to age or medical condition  Airway Management Planned: LMA and Oral ETT  Additional Equipment: None  Intra-op Plan:   Post-operative Plan: Extubation in OR  Informed Consent: I have reviewed the patients History and Physical, chart, labs and discussed the procedure including the risks, benefits and alternatives for the proposed anesthesia with the patient or authorized representative who has indicated his/her understanding and acceptance.     Dental advisory given  Plan Discussed with: CRNA  Anesthesia Plan Comments:        Anesthesia Quick Evaluation

## 2022-10-26 NOTE — Progress Notes (Signed)
Anesthesia Chart Review   Case: [id number] Date/Time: 10/27/22 1145   Procedure: OPEN RIGHT INGUINAL HERNIA REPAIR (Right)   Anesthesia type: General   Pre-op diagnosis: HERNIA   Location: [facility] ROOM 01 / [facility]   Surgeons: Clovis Riley, MD       DISCUSSION:67 y.o. smoker with h/o HTN, hernia scheduled for above procedure 10/27/2022 with Dr. Romana Juniper.   Pt seen by cardiology 10/08/2022. Per OV note, "Preoperative cardiac evaluation: The patient is doing well from a cardiac perspective. Therefore, based on ACC/AHA guidelines, the patient would be at acceptable risk for the planned procedure without further cardiovascular testing. According to the Revised Cardiac Risk Index (RCRI), his Perioperative Risk of Major Cardiac Event is (%): 0.4. His Functional Capacity in METs is: 8.23 according to the Duke Activity Status Index (DASI).  He may proceed with surgery without further cardiac testing. As noted below, anti-hypertensive medication adjustment made today. "  Verapamil increased to 180 mg at this visit.  Evaluate BP DOS.   BP Readings from Last 3 Encounters:  10/26/22 (!) 174/99  10/08/22 (!) 180/100  05/08/22 (!) 152/74    VS: BP (!) 174/99   Pulse (!) 58   Temp 36.6 C (Oral)   Resp 16   Ht '5\' 11"'$  (1.803 m)   Wt 88.9 kg   SpO2 98%   BMI 27.34 kg/m   PROVIDERS: Long, Scott, [name] is PCP    LABS: Labs reviewed: Acceptable for surgery. (all labs ordered are listed, but only abnormal results are displayed)  Labs Reviewed  CBC  BASIC METABOLIC PANEL     IMAGES:   EKG:   CV: Echo 06/26/2014 - Left ventricle: The cavity size was normal. Moderate asymmetric    septal hypertrophy, this is most evident in the parasternal long    axis. No LV outflow tract gradient. The estimated ejection    fraction was 55%. Wall motion was normal; there were no regional    wall motion abnormalities. Doppler parameters are consistent with    abnormal left ventricular relaxation  (grade 1 diastolic    dysfunction). Septal thickness, ED (2D): 16 mm. Posterior wall    thickness, ED (2D): 11.2 mm.  - Aortic valve: There was no stenosis.  - Mitral valve: No systolic anterior motion of the mitral valve.    There was trivial regurgitation.  - Right ventricle: The cavity size was normal. Systolic function    was normal.  - Right atrium: The atrium was mildly dilated.  - Pulmonary arteries: No complete TR doppler jet so unable to    estimate PA systolic pressure.  - Inferior vena cava: The vessel was normal in size. The    respirophasic diameter changes were in the normal range (>= 50%),    consistent with normal central venous pressure.  Past Medical History:  Diagnosis Date   Cataract    Chest pain    High cholesterol    Hx of cardiovascular stress test    ETT-Myoview 4/16:  No ischemia, EF 50%, normal wall motion, normal study   Hx of echocardiogram    Echo 11/15:  mod asymmetric septal hypertrophy, no LVOT gradient, EF 55%, no RWMA, Gr 1 DD, septal 16 mm, post wall 11.2 mm, no SAM, trivial MR, normal RVF, mild RAE (HCM variant vs LVH)   Hypertension    LVH (left ventricular hypertrophy)    non-obstructive HCM vs LVH on echo 06/2014   Snoring     Past Surgical History:  Procedure Laterality Date   CATARACT EXTRACTION, BILATERAL     WISDOM TOOTH EXTRACTION      MEDICATIONS:  Ascorbic Acid (VITAMIN C) 1000 MG tablet   aspirin EC 81 MG EC tablet   b complex vitamins capsule   ELDERBERRY PO   fluvastatin (LESCOL) 20 MG capsule   losartan (COZAAR) 100 MG tablet   Multiple Vitamin (MULTIVITAMIN WITH MINERALS) TABS tablet   Saw Palmetto, Serenoa repens, (SAW PALMETTO BERRY PO)   sildenafil (VIAGRA) 100 MG tablet   verapamil (CALAN-SR) 180 MG CR tablet    0.9 %  sodium chloride infusion    Dawud Mays Ward, [name] [facility] Pre-Surgical Testing [phone]

## 2022-10-27 ENCOUNTER — Ambulatory Visit (HOSPITAL_BASED_OUTPATIENT_CLINIC_OR_DEPARTMENT_OTHER): Payer: Medicare Other | Admitting: Anesthesiology

## 2022-10-27 ENCOUNTER — Encounter (HOSPITAL_COMMUNITY)
Admission: RE | Disposition: A | Discharge status: Home / Self Care | Payer: Self-pay | Source: Home / Self Care | Attending: Surgery

## 2022-10-27 ENCOUNTER — Ambulatory Visit (HOSPITAL_COMMUNITY): Payer: Medicare Other | Admitting: Anesthesiology

## 2022-10-27 ENCOUNTER — Encounter (HOSPITAL_COMMUNITY): Payer: Self-pay | Admitting: Surgery

## 2022-10-27 ENCOUNTER — Other Ambulatory Visit: Payer: Self-pay

## 2022-10-27 ENCOUNTER — Ambulatory Visit (HOSPITAL_COMMUNITY)
Admission: RE | Admit: 2022-10-27 | Discharge: 2022-10-27 | Disposition: A | Discharge status: Home / Self Care | Payer: Medicare Other | Attending: Surgery | Admitting: Surgery

## 2022-10-27 DIAGNOSIS — I10 Essential (primary) hypertension: Secondary | ICD-10-CM | POA: Diagnosis not present

## 2022-10-27 DIAGNOSIS — F17210 Nicotine dependence, cigarettes, uncomplicated: Secondary | ICD-10-CM | POA: Diagnosis not present

## 2022-10-27 DIAGNOSIS — K4090 Unilateral inguinal hernia, without obstruction or gangrene, not specified as recurrent: Secondary | ICD-10-CM | POA: Diagnosis present

## 2022-10-27 DIAGNOSIS — E7800 Pure hypercholesterolemia, unspecified: Secondary | ICD-10-CM | POA: Diagnosis not present

## 2022-10-27 DIAGNOSIS — Y9373 Activity, racquet and hand sports: Secondary | ICD-10-CM | POA: Diagnosis not present

## 2022-10-27 HISTORY — PX: INGUINAL HERNIA REPAIR: SHX194

## 2022-10-27 SURGERY — REPAIR, HERNIA, INGUINAL, ADULT
Anesthesia: General | Laterality: Right

## 2022-10-27 MED ORDER — EPHEDRINE SULFATE-NACL 50-0.9 MG/10ML-% IV SOSY
PREFILLED_SYRINGE | INTRAVENOUS | Status: DC | PRN
  Administered 2022-10-27: 10 mg via INTRAVENOUS

## 2022-10-27 MED ORDER — DEXAMETHASONE SODIUM PHOSPHATE 10 MG/ML IJ SOLN
INTRAMUSCULAR | Status: AC
  Filled 2022-10-27: qty 1

## 2022-10-27 MED ORDER — ONDANSETRON HCL 4 MG/2ML IJ SOLN
INTRAMUSCULAR | Status: DC | PRN
  Administered 2022-10-27: 4 mg via INTRAVENOUS

## 2022-10-27 MED ORDER — BUPIVACAINE LIPOSOME 1.3 % IJ SUSP
INTRAMUSCULAR | Status: AC
  Filled 2022-10-27: qty 20

## 2022-10-27 MED ORDER — LIDOCAINE 2% (20 MG/ML) 5 ML SYRINGE
INTRAMUSCULAR | Status: DC | PRN
  Administered 2022-10-27: 40 mg via INTRAVENOUS

## 2022-10-27 MED ORDER — OXYCODONE HCL 5 MG PO TABS: 5.0000 mg | ORAL_TABLET | Freq: Three times a day (TID) | ORAL | 0 refills | Status: AC | PRN

## 2022-10-27 MED ORDER — PROPOFOL 10 MG/ML IV BOLUS
INTRAVENOUS | Status: AC
  Filled 2022-10-27: qty 20

## 2022-10-27 MED ORDER — FENTANYL CITRATE (PF) 100 MCG/2ML IJ SOLN
INTRAMUSCULAR | Status: AC
  Filled 2022-10-27: qty 2

## 2022-10-27 MED ORDER — FENTANYL CITRATE PF 50 MCG/ML IJ SOSY: 25.0000 ug | PREFILLED_SYRINGE | INTRAMUSCULAR | Status: DC | PRN

## 2022-10-27 MED ORDER — LACTATED RINGERS IV SOLN: INTRAVENOUS | Status: DC

## 2022-10-27 MED ORDER — AMISULPRIDE (ANTIEMETIC) 5 MG/2ML IV SOLN: 10.0000 mg | Freq: Once | INTRAVENOUS | Status: DC | PRN

## 2022-10-27 MED ORDER — CHLORHEXIDINE GLUCONATE 4 % EX LIQD: 60.0000 mL | Freq: Once | CUTANEOUS | Status: DC

## 2022-10-27 MED ORDER — ACETAMINOPHEN 500 MG PO TABS
1000.0000 mg | ORAL_TABLET | ORAL | Status: AC
  Administered 2022-10-27: 1000 mg via ORAL
  Filled 2022-10-27: qty 2

## 2022-10-27 MED ORDER — ONDANSETRON HCL 4 MG/2ML IJ SOLN
INTRAMUSCULAR | Status: AC
  Filled 2022-10-27: qty 2

## 2022-10-27 MED ORDER — BUPIVACAINE-EPINEPHRINE (PF) 0.25% -1:200000 IJ SOLN
INTRAMUSCULAR | Status: AC
  Filled 2022-10-27: qty 30

## 2022-10-27 MED ORDER — OXYCODONE HCL 5 MG PO TABS: 5.0000 mg | ORAL_TABLET | Freq: Once | ORAL | Status: DC | PRN

## 2022-10-27 MED ORDER — CEFAZOLIN SODIUM-DEXTROSE 2-4 GM/100ML-% IV SOLN
2.0000 g | INTRAVENOUS | Status: AC
  Administered 2022-10-27: 2 g via INTRAVENOUS
  Filled 2022-10-27: qty 100

## 2022-10-27 MED ORDER — DOCUSATE SODIUM 100 MG PO CAPS: 100.0000 mg | ORAL_CAPSULE | Freq: Two times a day (BID) | ORAL | 0 refills | Status: AC

## 2022-10-27 MED ORDER — MIDAZOLAM HCL 2 MG/2ML IJ SOLN
INTRAMUSCULAR | Status: DC | PRN
  Administered 2022-10-27: 2 mg via INTRAVENOUS

## 2022-10-27 MED ORDER — EPHEDRINE 5 MG/ML INJ
INTRAVENOUS | Status: AC
  Filled 2022-10-27: qty 5

## 2022-10-27 MED ORDER — GABAPENTIN 300 MG PO CAPS
300.0000 mg | ORAL_CAPSULE | ORAL | Status: AC
  Administered 2022-10-27: 300 mg via ORAL
  Filled 2022-10-27: qty 1

## 2022-10-27 MED ORDER — DEXAMETHASONE SODIUM PHOSPHATE 10 MG/ML IJ SOLN
INTRAMUSCULAR | Status: DC | PRN
  Administered 2022-10-27: 4 mg via INTRAVENOUS

## 2022-10-27 MED ORDER — OXYCODONE HCL 5 MG/5ML PO SOLN: 5.0000 mg | Freq: Once | ORAL | Status: DC | PRN

## 2022-10-27 MED ORDER — CHLORHEXIDINE GLUCONATE 0.12 % MT SOLN
15.0000 mL | Freq: Once | OROMUCOSAL | Status: AC
  Administered 2022-10-27: 15 mL via OROMUCOSAL

## 2022-10-27 MED ORDER — BUPIVACAINE LIPOSOME 1.3 % IJ SUSP: 20.0000 mL | Freq: Once | INTRAMUSCULAR | Status: DC

## 2022-10-27 MED ORDER — MIDAZOLAM HCL 2 MG/2ML IJ SOLN
INTRAMUSCULAR | Status: AC
  Filled 2022-10-27: qty 2

## 2022-10-27 MED ORDER — FENTANYL CITRATE (PF) 100 MCG/2ML IJ SOLN
INTRAMUSCULAR | Status: DC | PRN
  Administered 2022-10-27 (×2): 25 ug via INTRAVENOUS

## 2022-10-27 MED ORDER — BUPIVACAINE-EPINEPHRINE (PF) 0.25% -1:200000 IJ SOLN
INTRAMUSCULAR | Status: DC | PRN
  Administered 2022-10-27: 50 mL

## 2022-10-27 MED ORDER — 0.9 % SODIUM CHLORIDE (POUR BTL) OPTIME
TOPICAL | Status: DC | PRN
  Administered 2022-10-27: 1000 mL

## 2022-10-27 MED ORDER — PROPOFOL 10 MG/ML IV BOLUS
INTRAVENOUS | Status: DC | PRN
  Administered 2022-10-27: 200 mg via INTRAVENOUS

## 2022-10-27 MED ORDER — LIDOCAINE HCL (PF) 2 % IJ SOLN
INTRAMUSCULAR | Status: AC
  Filled 2022-10-27: qty 5

## 2022-10-27 SURGICAL SUPPLY — 48 items
APL PRP STRL LF DISP 70% ISPRP (MISCELLANEOUS) ×1
APL SKNCLS STERI-STRIP NONHPOA (GAUZE/BANDAGES/DRESSINGS) ×1
BAG COUNTER SPONGE SURGICOUNT (BAG) IMPLANT
BAG SPNG CNTER NS LX DISP (BAG)
BENZOIN TINCTURE PRP APPL 2/3 (GAUZE/BANDAGES/DRESSINGS) ×1 IMPLANT
BLADE SURG 15 STRL LF DISP TIS (BLADE) ×1 IMPLANT
BLADE SURG 15 STRL SS (BLADE) ×1
CHLORAPREP W/TINT 26 (MISCELLANEOUS) ×1 IMPLANT
COVER SURGICAL LIGHT HANDLE (MISCELLANEOUS) ×1 IMPLANT
DRAIN PENROSE 0.5X18 (DRAIN) ×1 IMPLANT
DRAPE LAPAROSCOPIC ABDOMINAL (DRAPES) ×1 IMPLANT
ELECT REM PT RETURN 15FT ADLT (MISCELLANEOUS) ×1 IMPLANT
GAUZE SPONGE 4X4 12PLY STRL (GAUZE/BANDAGES/DRESSINGS) IMPLANT
GLOVE BIO SURGEON STRL SZ 6 (GLOVE) ×1 IMPLANT
GLOVE INDICATOR 6.5 STRL GRN (GLOVE) ×1 IMPLANT
GLOVE SS BIOGEL STRL SZ 6 (GLOVE) ×1 IMPLANT
GOWN STRL REUS W/ TWL LRG LVL3 (GOWN DISPOSABLE) ×1 IMPLANT
GOWN STRL REUS W/ TWL XL LVL3 (GOWN DISPOSABLE) IMPLANT
GOWN STRL REUS W/TWL LRG LVL3 (GOWN DISPOSABLE) ×1
GOWN STRL REUS W/TWL XL LVL3 (GOWN DISPOSABLE)
KIT BASIN OR (CUSTOM PROCEDURE TRAY) ×1 IMPLANT
KIT TURNOVER KIT A (KITS) IMPLANT
MARKER SKIN DUAL TIP RULER LAB (MISCELLANEOUS) ×1 IMPLANT
MESH ULTRAPRO 3X6 7.6X15CM (Mesh General) IMPLANT
NDL HYPO 22X1.5 SAFETY MO (MISCELLANEOUS) ×1 IMPLANT
NEEDLE HYPO 22X1.5 SAFETY MO (MISCELLANEOUS) ×1 IMPLANT
NEEDLE SAFETY HYPO 22GAX1.5 (MISCELLANEOUS) ×1
PACK BASIC VI WITH GOWN DISP (CUSTOM PROCEDURE TRAY) ×1 IMPLANT
PENCIL SMOKE EVACUATOR (MISCELLANEOUS) IMPLANT
SPIKE FLUID TRANSFER (MISCELLANEOUS) ×1 IMPLANT
SPONGE T-LAP 4X18 ~~LOC~~+RFID (SPONGE) ×1 IMPLANT
STRIP CLOSURE SKIN 1/2X4 (GAUZE/BANDAGES/DRESSINGS) ×1 IMPLANT
SUT ETHIBOND 0 MO6 C/R (SUTURE) ×1 IMPLANT
SUT MNCRL AB 4-0 PS2 18 (SUTURE) ×1 IMPLANT
SUT PDS AB 0 CT1 36 (SUTURE) ×2 IMPLANT
SUT SILK 3 0 (SUTURE) ×1
SUT SILK 3-0 18XBRD TIE 12 (SUTURE) ×1 IMPLANT
SUT VIC AB 3-0 SH 27 (SUTURE) ×2
SUT VIC AB 3-0 SH 27XBRD (SUTURE) ×2 IMPLANT
SUT VICRYL 0 UR6 27IN ABS (SUTURE) IMPLANT
SUT VICRYL 3 0 BR 18  UND (SUTURE) ×1
SUT VICRYL 3 0 BR 18 UND (SUTURE) ×1 IMPLANT
SYR CONTROL 10ML LL (SYRINGE) ×1 IMPLANT
TAPE CLOTH SURG 4X10 WHT LF (GAUZE/BANDAGES/DRESSINGS) IMPLANT
TAPE STRIPS DRAPE STRL (GAUZE/BANDAGES/DRESSINGS) IMPLANT
TOWEL OR 17X26 10 PK STRL BLUE (TOWEL DISPOSABLE) ×1 IMPLANT
TOWEL OR NON WOVEN STRL DISP B (DISPOSABLE) ×1 IMPLANT
TRAY FOLEY MTR SLVR 16FR STAT (SET/KITS/TRAYS/PACK) IMPLANT

## 2022-10-27 NOTE — Discharge Instructions (Signed)
HERNIA REPAIR: POST OP INSTRUCTIONS   EAT Gradually transition to a high fiber diet with a fiber supplement over the next few weeks after discharge.  Start with a pureed / full liquid diet (see below)  WALK Walk an hour a day (cumulative- not all at once).  Control your pain to do that.    CONTROL PAIN Control pain so that you can walk, sleep, tolerate sneezing/coughing, and go up/down stairs.  HAVE A BOWEL MOVEMENT DAILY Keep your bowels regular to avoid problems.  OK to try a laxative to override constipation.  OK to use an antidiarrheal to slow down diarrhea.  Call if not better after 2 tries  CALL IF YOU HAVE PROBLEMS/CONCERNS Call if you are still struggling despite following these instructions. Call if you have concerns not answered by these instructions  ######################################################################    DIET: Follow a light bland diet & liquids the first 24 hours after arrival home, such as soup, liquids, starches, etc.  Be sure to drink plenty of fluids.  Quickly advance to a usual solid diet within a few days.  Avoid fast food or heavy meals initially as you are more likely to get nauseated or have irregular bowels.  A low-sugar, high-fiber diet for the rest of your life is ideal.   Take your usually prescribed home medications unless otherwise directed.  PAIN CONTROL: Pain is best controlled by a usual combination of three different methods TOGETHER: Ice/Heat Over the counter pain medication Prescription pain medication Most patients will experience some swelling and bruising around the hernia(s) such as the bellybutton, groins, or old incisions.  Ice packs or heating pads (30-60 minutes up to 6 times a day) will help. Use ice for the first few days to help decrease swelling and bruising, then switch to heat to help relax tight/sore spots and speed recovery.  Some people prefer to use ice alone, heat alone, alternating between ice & heat.  Experiment  to what works for you.  Swelling and bruising can take several weeks to resolve.   It is helpful to take an over-the-counter pain medication regularly for the first days: Naproxen (Aleve, etc)  Two '220mg'$  tabs twice a day OR Ibuprofen (Advil, etc) Three '200mg'$  tabs four times a day (every meal & bedtime) AND Acetaminophen (Tylenol, etc) 325-'650mg'$  four times a day (every meal & bedtime) A  prescription for pain medication should be given to you upon discharge.  Take your pain medication as prescribed, IF NEEDED.  If you are having problems/concerns with the prescription medicine (does not control pain, nausea, vomiting, rash, itching, etc), please call us [phone] to see if we need to switch you to a different pain medicine that will work better for you and/or control your side effect better. If you need a refill on your pain medication, please contact your pharmacy.  They will contact our office to request authorization. Prescriptions will not be filled after 5 pm or on week-ends.  Avoid getting constipated.  Between the surgery and the pain medications, it is common to experience some constipation.  Increasing fluid intake and taking a fiber supplement (such as Metamucil, Citrucel, FiberCon, MiraLax, etc) 1-2 times a day regularly will usually help prevent this problem from occurring.  A mild laxative (prune juice, Milk of Magnesia, MiraLax, etc) should be taken according to package directions if there are no bowel movements after 48 hours.    Wash / shower every day, starting 2 days after surgery.  You may shower over  the steri strips which are waterproof.  No rubbing, scrubbing, lotions or ointments to incision(s). Do not soak or submerge.   Remove your outer bandage 2 days after surgery. Steri strips (white tapes) will peel off after 1-2 weeks.   You may leave the incision open to air.  You may replace a dressing/Band-Aid to cover an incision for comfort if you wish.  Continue to shower over  incision(s) after the dressing is off.  ACTIVITIES as tolerated:   You may resume regular (light) daily activities beginning the next day--such as daily self-care, walking, climbing stairs--gradually increasing activities as tolerated.  Control your pain so that you can walk an hour a day.  If you can walk 30 minutes without difficulty, it is safe to try more intense activity such as jogging, treadmill, bicycling, low-impact aerobics, swimming, etc. Refrain from the most intensive and strenuous activity such as sit-ups, heavy lifting, contact sports, etc  Refrain from any heavy lifting or straining until 6 weeks after surgery.   DO NOT PUSH THROUGH PAIN.  Let pain be your guide: If it hurts to do something, don't do it.  Pain is your body warning you to avoid that activity for another week until the pain goes down. You may drive when you are no longer taking prescription pain medication, you can comfortably wear a seatbelt, and you can safely maneuver your car and apply brakes. You may have sexual intercourse when it is comfortable.   FOLLOW UP in our office Please call [facility] at [phone] to set up an appointment to see your surgeon in the office for a follow-up appointment approximately 2-3 weeks after your surgery. Make sure that you call for this appointment the day you arrive home to insure a convenient appointment time.  9.  If you have disability of FMLA / Family leave forms, please bring the forms to the office for processing.  (do not give to your surgeon).  WHEN TO CALL us [phone]: Poor pain control Reactions / problems with new medications (rash/itching, nausea, etc)  Fever over 101.5 F (38.5 C) Inability to urinate Nausea and/or vomiting Worsening swelling or bruising Continued bleeding from incision. Increased pain, redness, or drainage from the incision   The clinic staff is available to answer your questions during regular business hours (8:30am-5pm).  Please don't  hesitate to call and ask to speak to one of our nurses for clinical concerns.   If you have a medical emergency, go to the nearest emergency room or call 911.  A surgeon from [facility] is always on call at the hospitals in [facility], [place], [place], [place], [place]  [address] ?  [address] [address], [place], [place]   [address] MAIN: [phone] ? TOLL FREE: [phone] ? FAX: [phone] [email or web address]

## 2022-10-27 NOTE — Op Note (Signed)
Operative Note  Luke Wheeler  [id number]  [id number]  10/27/2022   Surgeon: Romana Juniper MD FACS   Procedure performed: Open right inguinal hernia repair with mesh   Preop diagnosis:  right inguinal hernia   Post-op diagnosis/intraop findings: direct inguinal hernia   Specimens: none   EBL: 5cc   Complications: none   Description of procedure: After confirming informed consent, the patient was taken to the operating room and placed supine on operating room table where general LMA anesthesia was initiated, preoperative antibiotics were administered, SCDs applied, and a formal timeout was performed. The groin was clipped, prepped and draped in the usual sterile fashion. An oblique incision was made the just above the inguinal ligament after infiltrating the tissues with local anesthetic (exparel mixed with 0.25% marcaine with epinephrine). Soft tissues were dissected using electrocautery until the external oblique aponeurosis was encountered. This was divided sharply to expand the external ring. A plane was bluntly developed between the spermatic cord and the external oblique. The spermatic cord was then bluntly dissected away from the pubic tubercle and encircled with a Penrose. Inspection of the inguinal anatomy revealed a moderate direct hernia and a small cord lipoma. The spermatic cord was carefully dissected free of surrounding cremasteric fibers and a small cord lipoma was excised, ligating the pedicle at the level of the internal ring with a 3-0 vicryl tie. The internal ring and spermatic cord were inspected and there was no indirect sac. The direct hernia was inspected; there was essentially complete disruption of the inguinal floor.  The inguinal floor was reconstructed, suturing the conjoint tendon to the inguinal ligament with interrupted 0 PDS, leaving an internal ring just sufficient for the cord structures. A 3 x 6 piece of ultra Pro mesh was brought onto the field and  trimmed to approximate the field. This was sutured to the pubic tubercle fascia, inferior shelving edge and to the internal oblique superiorly with interrupted 0 ethibonds. The tails of the mesh were wrapped around the spermatic cord, ensuring adequate room for the cord, and sutured to each other with 0 ethibond, and then directed laterally to lie flat beneath the external oblique aponeurosis. An additional 3-0 ethibond was placed to reinforce the medial aspect of the slit in the mesh. Hemostasis was ensured within the wound. The Penrose was removed. The external oblique aponeurosis was reapproximated with a running 3-0 Vicryl to re-create a narrowed external ring. More local was infiltrated around the pubic tubercle and in the plane just below the external oblique. The Scarpa's was reapproximated with interrupted 3-0 Vicryls. The skin was closed with a running subcuticular 4-0 Monocryl. The remainder of the local was injected in the subcutaneous and subcuticular space. The field was then cleaned, benzoin and Steri-Strips and sterile bandage were applied. Both testicles were palpated in the scrotum at the end of the case. The patient was then awakened extubated and taken to PACU in stable condition.    All counts were correct at the completion of the case

## 2022-10-27 NOTE — Transfer of Care (Signed)
Immediate Anesthesia Transfer of Care Note  Patient: Luke Wheeler  Procedure(s) Performed: OPEN RIGHT INGUINAL HERNIA REPAIR (Right)  Patient Location: PACU  Anesthesia Type:General  Level of Consciousness: drowsy  Airway & Oxygen Therapy: Patient Spontanous Breathing and Patient connected to face mask oxygen  Post-op Assessment: Report given to RN, Post -op Vital signs reviewed and stable, and Patient moving all extremities X 4  Post vital signs: Reviewed and stable  Last Vitals:  Vitals Value Taken Time  BP 147/78   Temp    Pulse 65 10/27/22 1239  Resp 15 10/27/22 1239  SpO2 100 % 10/27/22 1239  Vitals shown include unvalidated device data.  Last Pain:  Vitals:   10/27/22 1016  TempSrc:   PainSc: 0-No pain         Complications: No notable events documented.

## 2022-10-27 NOTE — Anesthesia Procedure Notes (Signed)
Procedure Name: LMA Insertion Date/Time: 10/27/2022 11:35 AM  Performed by: Niel Hummer, CRNAPre-anesthesia Checklist: Patient identified, Suction available, Emergency Drugs available and Patient being monitored Patient Re-evaluated:Patient Re-evaluated prior to induction Oxygen Delivery Method: Circle system utilized Preoxygenation: Pre-oxygenation with 100% oxygen Induction Type: IV induction LMA: LMA with gastric port inserted LMA Size: 4.0 Number of attempts: 1 Dental Injury: Teeth and Oropharynx as per pre-operative assessment

## 2022-10-27 NOTE — Anesthesia Postprocedure Evaluation (Signed)
Anesthesia Post Note  Patient: Luke Wheeler  Procedure(s) Performed: OPEN RIGHT INGUINAL HERNIA REPAIR (Right)     Patient location during evaluation: PACU Anesthesia Type: General Level of consciousness: awake and alert Pain management: pain level controlled Vital Signs Assessment: post-procedure vital signs reviewed and stable Respiratory status: spontaneous breathing, nonlabored ventilation, respiratory function stable and patient connected to nasal cannula oxygen Cardiovascular status: blood pressure returned to baseline and stable Postop Assessment: no apparent nausea or vomiting Anesthetic complications: no  No notable events documented.  Last Vitals:  Vitals:   10/27/22 1330 10/27/22 1345  BP: (!) 150/79 (!) 144/75  Pulse: 63 70  Resp: 20 (!) 22  Temp: (!) 36.4 C   SpO2: 100% 100%    Last Pain:  Vitals:   10/27/22 1345  TempSrc:   PainSc: 0-No pain                 Tiajuana Amass

## 2022-10-27 NOTE — Interval H&P Note (Signed)
History and Physical Interval Note:  10/27/2022 10:42 AM  Luke Wheeler  has presented today for surgery, with the diagnosis of HERNIA.  The various methods of treatment have been discussed with the patient and family. After consideration of risks, benefits and other options for treatment, the patient has consented to  Procedure(s): OPEN RIGHT INGUINAL HERNIA REPAIR (Right) as a surgical intervention.  The patient's history has been reviewed, patient examined, no change in status, stable for surgery.  I have reviewed the patient's chart and labs.  Questions were answered to the patient's satisfaction.     Martinez Boxx Rich Brave

## 2022-10-28 ENCOUNTER — Encounter (HOSPITAL_COMMUNITY): Payer: Self-pay | Admitting: Surgery

## 2022-11-30 NOTE — Progress Notes (Unsigned)
Cardiology Office Note:    Date:  12/01/2022   ID:  Luke Wheeler, DOB 1955/12/23, MRN [id number]  PCP:  Lindaann Pascal, [name]   [facility] Providers Cardiologist:  Charlton Haws, MD     Referring MD: Lindaann Pascal, [name]   Chief Complaint: hypertension  History of Present Illness:    Luke Wheeler is a very pleasant 67 y.o. male with a hx of HTN and asymmetric septal hypertrophy with no evidence of HOCM only, LVEF 56%by cardiac MRI October 2016 and October 2020 with septal thickness 14 mm, LVEF 56%.  Atypical chest pain April 2016 with normal Myoview, EF 63%. Does not like beta-blocker for HTN due to erectile dysfunction. Quit smoking October 2021. Lung cancer CT 07/26/2021 benign. Lipids have been followed by PCP.  He is retired, staying active Forensic psychologist. Wife works for division of [employer].   Last cardiology clinic visit was 04/22/2022 with Dr. Eden Emms.  ECG revealed inferior lateral biphasic T waves likely related to HTN/LVH. He reported some exertional chest discomfort, not tightness.  He underwent coronary CTA which revealed coronary calcium score 10.5 (47 percentile) with mild CAD, 25 to 49% stenosis ostial and mid distal RCA, proximal PLA, ostial RI.  Minimal stenosis less than 25% mid LAD, proximal LCx.   Seen in clinic by me on 10/08/22 for preoperative cardiac evaluation for upcoming hernia repair. He was rushing to get here and BP is quite elevated. Reports it was 140/90 mmHg at recent office visit with surgeon. Occasional palpitations that do not occur very often, most often when he is relaxed. Has shortness of breath only when playing long tennis matches. Had to stop tennis due to inguinal hernia. Admits that at times, he runs out of his medication. Returned from recent 3 week trip and ran out of medication for about one week. Does not limit dietary intake of sodium. He denies chest pain, lower extremity edema, fatigue, palpitations, melena, hematuria, hemoptysis,  diaphoresis, weakness, presyncope, syncope, orthopnea, and PND. He was deemed low risk for upcoming surgery and underwent hernia surgery on 10/27/22. Verapamil was increased to 180 mg daily and he was advised to follow-up in 2 to 3 months for blood pressure management.  Today, he is here alone for follow-up. Reports he is feeling well. Has not been able to resume exercise since hernia surgery. Has a bulge in the site of the inguinal hernia repair which he reports has decreased slightly. He feels a pulling sensation when he is walking. Otherwise, he is feeling well. Has not monitored BP. Admits to not taking antihypertensives daily, forgets and only takes about every other day. Admits that he eats out most meals because his wife travels for work. Frequent visits [employer]. He denies chest pain, shortness of breath, lower extremity edema, fatigue, palpitations, melena, hematuria, hemoptysis, diaphoresis, weakness, presyncope, syncope, orthopnea, and PND.   Past Medical History:  Diagnosis Date   Cataract    Chest pain    High cholesterol    Hx of cardiovascular stress test    ETT-Myoview 4/16:  No ischemia, EF 50%, normal wall motion, normal study   Hx of echocardiogram    Echo 11/15:  mod asymmetric septal hypertrophy, no LVOT gradient, EF 55%, no RWMA, Gr 1 DD, septal 16 mm, post wall 11.2 mm, no SAM, trivial MR, normal RVF, mild RAE (HCM variant vs LVH)   Hypertension    LVH (left ventricular hypertrophy)    non-obstructive HCM vs LVH on echo 06/2014  Snoring     Past Surgical History:  Procedure Laterality Date   CATARACT EXTRACTION, BILATERAL     INGUINAL HERNIA REPAIR Right 10/27/2022   Procedure: OPEN RIGHT INGUINAL HERNIA REPAIR;  Surgeon: Berna Bue, MD;  Location: [facility];  Service: General;  Laterality: Right;   WISDOM TOOTH EXTRACTION      Current Medications: Current Meds  Medication Sig   Ascorbic Acid (VITAMIN C) 1000 MG tablet Take 1,000 mg by mouth 3  (three) times a week.   aspirin EC 81 MG EC tablet Take 1 tablet (81 mg total) by mouth daily. (Patient taking differently: Take 81 mg by mouth at bedtime.)   b complex vitamins capsule Take 1 capsule by mouth daily.   ELDERBERRY PO Take 2-3 tablets by mouth See admin instructions. Take 5 times weekly   fluvastatin (LESCOL) 20 MG capsule Take 1 capsule (20 mg total) by mouth daily.   losartan (COZAAR) 100 MG tablet Take 1 tablet (100 mg total) by mouth daily.   Saw Palmetto, Serenoa repens, (SAW PALMETTO BERRY PO) Take 540 mg by mouth daily.   sildenafil (VIAGRA) 100 MG tablet Take 1 tablet (100 mg total) by mouth daily as needed for erectile dysfunction.   verapamil (CALAN-SR) 180 MG CR tablet Take 1 tablet (180 mg total) by mouth at bedtime.     Allergies:   Patient has no known allergies.   Social History   Socioeconomic History   Marital status: Married    Spouse name: Not on file   Number of children: 4   Years of education: Not on file   Highest education level: Not on file  Occupational History   Not on file  Tobacco Use   Smoking status: Every Day    Types: Cigarettes   Smokeless tobacco: Never  Vaping Use   Vaping Use: Never used  Substance and Sexual Activity   Alcohol use: Yes    Comment: occas   Drug use: No   Sexual activity: Yes  Other Topics Concern   Not on file  Social History Narrative   Not on file   Social Determinants of Health   Financial Resource Strain: Not on file  Food Insecurity: Not on file  Transportation Needs: Not on file  Physical Activity: Not on file  Stress: Not on file  Social Connections: Not on file     Family History: The patient's family history includes Coronary artery disease in his father; Diabetes in his mother; Hyperlipidemia in his father; Stroke in his father. There is no history of Colon cancer, Esophageal cancer, Rectal cancer, or Stomach cancer.  ROS:   Please see the history of present illness.  All other systems  reviewed and are negative.  Labs/Other Studies Reviewed:    The following studies were reviewed today:  Coronary CTA 05/11/22 IMPRESSION: 1. Mild CAD, 25-49% stenosis, CADRADS 2.   2. Coronary calcium score is 10.5, which places the patient in the 47th percentile for age and sex matched control.   3. Normal coronary origins with right dominance  Cardiac MR 06/02/19 1. Asymmetric hypertrophy measuring up to 71mm in basal septum and lateral walls (59mm inferior wall), not meeting criteria for hypertrophic cardiomyopathy (<15 mm)   2. RV insertion site LGE, which can be seen in setting of elevated pulmonary pressures   3.  Normal LV size and systolic function (EF 56%)   4.  Normal RV size and systolic function (EF 53%)  Stress Test 12/04/14 Impression Exercise  Capacity:  Good exercise capacity. BP Response:  Normal blood pressure response. Clinical Symptoms:  No significant symptoms noted. ECG Impression:  No significant ST segment change suggestive of ischemia. Comparison with Prior Nuclear Study: No previous nuclear study performed   Overall Impression:  Normal stress nuclear study.   LV Wall Motion: Low normal LV Function, EF 50%; NL Wall Motion   Recent Labs: 10/26/2022: BUN 16; Creatinine, Ser 1.05; Hemoglobin 13.8; Platelets 273; Potassium 3.9; Sodium 138  Recent Lipid Panel No results found for: "CHOL", "TRIG", "HDL", "CHOLHDL", "VLDL", "LDLCALC", "LDLDIRECT"   Risk Assessment/Calculations:       Physical Exam:    VS:  BP (!) 142/70   Pulse 66   Ht  (1.803 m)   Wt 205 lb (93 kg)   SpO2 98%   BMI 28.59 kg/m     Wt Readings from Last 3 Encounters:  12/01/22 205 lb (93 kg)  10/27/22 194 lb 0.1 oz (88 kg)  10/26/22 196 lb (88.9 kg)     GEN:  Well nourished, well developed in no acute distress HEENT: Normal NECK: No JVD; No carotid bruits CARDIAC: RRR, no murmurs, rubs, gallops RESPIRATORY:  Clear to auscultation without rales, wheezing or rhonchi   ABDOMEN: Soft, non-tender, non-distended MUSCULOSKELETAL:  No edema; No deformity. 2+ pedal pulses, equal bilaterally SKIN: Warm and dry NEUROLOGIC:  Alert and oriented x 3 PSYCHIATRIC:  Normal affect   EKG:  EKG is not ordered today  HYPERTENSION CONTROL Vitals:   12/01/22 1042 12/01/22 1105  BP: (!) 150/78 (!) 142/70    The patient's blood pressure is elevated above target today.  In order to address the patient's elevated BP: Blood pressure will be monitored at home to determine if medication changes need to be made. (needs to improve compliance)     Diagnoses:    1. Hypertensive heart disease without heart failure   2. LVH (left ventricular hypertrophy)   3. Coronary artery disease involving native coronary artery of native heart without angina pectoris   4. Hyperlipidemia LDL goal <70     Assessment and Plan:     Hypertensive heart disease: BP is elevated today. Admits he takes his medications only about every other day.  Lengthy discussion about the potential harm from untreated hypertension. He would like to work on improved consistency with current medications and return for soon follow-up.  Encouraged low-sodium, heart healthy, mostly plant-based diet. Dietary information provided. Continue losartan and verapamil. Return in 1 month for medication titration.   LVH: Cardiac MRI 05/2019 with normal LV function, asymmetric hypertrophy measuring up to 14 mm and basal septum and lateral walls, does not meet criteria for hypertrophic cardiomyopathy, no gadolinium uptake other than RV insertion site. He is asymptomatic presently but has been less active recently due to inguinal hernia. Emphasized the importance of better BP control. Lengthy discussion about the importance of healthy dieat, avoiding sodium and processed foods, and medication compliance. Consider repeat echo if develops symptoms with exertion once he resumes exercise.   CAD without angina: Coronary CTA 04/2022 with  coronary calcium score 10.5 (47th %), mild CAD mid-distal RCA (25-49%), ostial RI, <25% mid LAD and LCx. He denies chest pain, dyspnea, or other symptoms concerning for angina.  No indication for further ischemic evaluation at this time. Encouraged 150 minutes moderate intensity exercise each week along with heart healthy diet for secondary prevention.  Continue aspirin, verapamil, fluvastatin, losartan. Need to ensure LDL is below goal < 70.  Hyperlipidemia LDL  goal < 70: No recent lipid panel to review.  We will recheck today. He is on fluvastatin. Would recommend high dose statin if LDL remains > 70.   Disposition: 1 month with me  Medication Adjustments/Labs and Tests Ordered: Current medicines are reviewed at length with the patient today.  Concerns regarding medicines are outlined above.  Orders Placed This Encounter  Procedures   Lipid Profile   Direct LDL   Hepatic function panel   No orders of the defined types were placed in this encounter.   Patient Instructions  Medication Instructions:   Your physician recommends that you continue on your current medications as directed. Please refer to the Current Medication list given to you today.   *If you need a refill on your cardiac medications before your next appointment, please call your pharmacy*   Lab Work:  TODAY!!!!! LIPID/DIRECT LDL/LFT  If you have labs (blood work) drawn today and your tests are completely normal, you will receive your results only by: MyChart Message (if you have MyChart) OR A paper copy in the mail If you have any lab test that is abnormal or we need to change your treatment, we will call you to review the results.   Testing/Procedures:  None ordered.   Follow-Up: At [facility], you and your health needs are our priority.  As part of our continuing mission to provide you with exceptional heart care, we have created designated Provider Care Teams.  These Care Teams include your  primary Cardiologist (physician) and Advanced Practice Providers (APPs -  Physician Assistants and Nurse Practitioners) who all work together to provide you with the care you need, when you need it.  We recommend signing up for the patient portal called "MyChart".  Sign up information is provided on this After Visit Summary.  MyChart is used to connect with patients for Virtual Visits (Telemedicine).  Patients are able to view lab/test results, encounter notes, upcoming appointments, etc.  Non-urgent messages can be sent to your provider as well.   To learn more about what you can do with MyChart, go to [email or web address].    Your next appointment:   1 month(s)  Provider:   Eligha BridegroomMichelle Oline Belk, NP         Other Instructions  Mediterranean Diet A Mediterranean diet refers to food and lifestyle choices that are based on the traditions of countries located on the [place] Sea. It focuses on eating more fruits, vegetables, whole grains, beans, nuts, seeds, and heart-healthy fats, and eating less dairy, meat, eggs, and processed foods with added sugar, salt, and fat. This way of eating has been shown to help prevent certain conditions and improve outcomes for people who have chronic diseases, like kidney disease and heart disease. What are tips for following this plan? Reading food labels Check the serving size of packaged foods. For foods such as rice and pasta, the serving size refers to the amount of cooked product, not dry. Check the total fat in packaged foods. Avoid foods that have saturated fat or trans fats. Check the ingredient list for added sugars, such as corn syrup. Shopping  Buy a variety of foods that offer a balanced diet, including: Fresh fruits and vegetables (produce). Grains, beans, nuts, and seeds. Some of these may be available in unpackaged forms or large amounts (in bulk). Fresh seafood. Poultry and eggs. Low-fat dairy products. Buy whole ingredients instead of  prepackaged foods. Buy fresh fruits and vegetables in-season from local farmers markets. Buy plain frozen  fruits and vegetables. If you do not have access to quality fresh seafood, buy precooked frozen shrimp or canned fish, such as tuna, salmon, or sardines. Stock your pantry so you always have certain foods on hand, such as olive oil, canned tuna, canned tomatoes, rice, pasta, and beans. Cooking Cook foods with extra-virgin olive oil instead of using butter or other vegetable oils. Have meat as a side dish, and have vegetables or grains as your main dish. This means having meat in small portions or adding small amounts of meat to foods like pasta or stew. Use beans or vegetables instead of meat in common dishes like chili or lasagna. Experiment with different cooking methods. Try roasting, broiling, steaming, and sauting vegetables. Add frozen vegetables to soups, stews, pasta, or rice. Add nuts or seeds for added healthy fats and plant protein at each meal. You can add these to yogurt, salads, or vegetable dishes. Marinate fish or vegetables using olive oil, lemon juice, garlic, and fresh herbs. Meal planning Plan to eat one vegetarian meal one day each week. Try to work up to two vegetarian meals, if possible. Eat seafood two or more times a week. Have healthy snacks readily available, such as: Vegetable sticks with hummus. Greek yogurt. Fruit and nut trail mix. Eat balanced meals throughout the week. This includes: Fruit: 2-3 servings a day. Vegetables: 4-5 servings a day. Low-fat dairy: 2 servings a day. Fish, poultry, or lean meat: 1 serving a day. Beans and legumes: 2 or more servings a week. Nuts and seeds: 1-2 servings a day. Whole grains: 6-8 servings a day. Extra-virgin olive oil: 3-4 servings a day. Limit red meat and sweets to only a few servings a month. Lifestyle  Cook and eat meals together with your family, when possible. Drink enough fluid to keep your urine pale  yellow. Be physically active every day. This includes: Aerobic exercise like running or swimming. Leisure activities like gardening, walking, or housework. Get 7-8 hours of sleep each night. If recommended by your health care provider, drink red wine in moderation. This means 1 glass a day for nonpregnant women and 2 glasses a day for men. A glass of wine equals 5 oz (150 mL). What foods should I eat? Fruits Apples. Apricots. Avocado. Berries. Bananas. Cherries. Dates. Figs. Grapes. Lemons. Melon. Oranges. Peaches. Plums. Pomegranate. Vegetables Artichokes. Beets. Broccoli. Cabbage. Carrots. Eggplant. Green beans. Chard. Kale. Spinach. Onions. Leeks. Peas. Squash. Tomatoes. Peppers. Radishes. Grains Whole-grain pasta. Brown rice. Bulgur wheat. Polenta. Couscous. Whole-wheat bread. Orpah Cobb. Meats and other proteins Beans. Almonds. Sunflower seeds. Pine nuts. Peanuts. Cod. Salmon. Scallops. Shrimp. Tuna. Tilapia. Clams. Oysters. Eggs. Poultry without skin. Dairy Low-fat milk. Cheese. Greek yogurt. Fats and oils Extra-virgin olive oil. Avocado oil. Grapeseed oil. Beverages Water. Red wine. Herbal tea. Sweets and desserts Greek yogurt with honey. Baked apples. Poached pears. Trail mix. Seasonings and condiments Basil. Cilantro. Coriander. Cumin. Mint. Parsley. Sage. Rosemary. Tarragon. Garlic. Oregano. Thyme. Pepper. Balsamic vinegar. Tahini. Hummus. Tomato sauce. Olives. Mushrooms. The items listed above may not be a complete list of foods and beverages you can eat. Contact a dietitian for more information. What foods should I limit? This is a list of foods that should be eaten rarely or only on special occasions. Fruits Fruit canned in syrup. Vegetables Deep-fried potatoes (french fries). Grains Prepackaged pasta or rice dishes. Prepackaged cereal with added sugar. Prepackaged snacks with added sugar. Meats and other proteins Beef. Pork. Lamb. Poultry with skin. Hot dogs.  Tomasa Blase. Dairy Ice cream.  Sour cream. Whole milk. Fats and oils Butter. Canola oil. Vegetable oil. Beef fat (tallow). Lard. Beverages Juice. Sugar-sweetened soft drinks. Beer. Liquor and spirits. Sweets and desserts Cookies. Cakes. Pies. Candy. Seasonings and condiments Mayonnaise. Pre-made sauces and marinades. The items listed above may not be a complete list of foods and beverages you should limit. Contact a dietitian for more information. Summary The Mediterranean diet includes both food and lifestyle choices. Eat a variety of fresh fruits and vegetables, beans, nuts, seeds, and whole grains. Limit the amount of red meat and sweets that you eat. If recommended by your health care provider, drink red wine in moderation. This means 1 glass a day for nonpregnant women and 2 glasses a day for men. A glass of wine equals 5 oz (150 mL). This information is not intended to replace advice given to you by your health care provider. Make sure you discuss any questions you have with your health care provider. Document Revised: 09/15/2019 Document Reviewed: 07/13/2019 Elsevier Patient Education  2023 Elsevier Inc. DASH Eating Plan DASH stands for Dietary Approaches to Stop Hypertension. The DASH eating plan is a healthy eating plan that has been shown to: Reduce high blood pressure (hypertension). Reduce your risk for type 2 diabetes, heart disease, and stroke. Help with weight loss. What are tips for following this plan? Reading food labels Check food labels for the amount of salt (sodium) per serving. Choose foods with less than 5 percent of the Daily Value of sodium. Generally, foods with less than 300 milligrams (mg) of sodium per serving fit into this eating plan. To find whole grains, look for the word "whole" as the first word in the ingredient list. Shopping Buy products labeled as "low-sodium" or "no salt added." Buy fresh foods. Avoid canned foods and pre-made or frozen  meals. Cooking Avoid adding salt when cooking. Use salt-free seasonings or herbs instead of table salt or sea salt. Check with your health care provider or pharmacist before using salt substitutes. Do not fry foods. Cook foods using healthy methods such as baking, boiling, grilling, roasting, and broiling instead. Cook with heart-healthy oils, such as olive, canola, avocado, soybean, or sunflower oil. Meal planning  Eat a balanced diet that includes: 4 or more servings of fruits and 4 or more servings of vegetables each day. Try to fill one-half of your plate with fruits and vegetables. 6-8 servings of whole grains each day. Less than 6 oz (170 g) of lean meat, poultry, or fish each day. A 3-oz (85-g) serving of meat is about the same size as a deck of cards. One egg equals 1 oz (28 g). 2-3 servings of low-fat dairy each day. One serving is 1 cup (237 mL). 1 serving of nuts, seeds, or beans 5 times each week. 2-3 servings of heart-healthy fats. Healthy fats called omega-3 fatty acids are found in foods such as walnuts, flaxseeds, fortified milks, and eggs. These fats are also found in cold-water fish, such as sardines, salmon, and mackerel. Limit how much you eat of: Canned or prepackaged foods. Food that is high in trans fat, such as some fried foods. Food that is high in saturated fat, such as fatty meat. Desserts and other sweets, sugary drinks, and other foods with added sugar. Full-fat dairy products. Do not salt foods before eating. Do not eat more than 4 egg yolks a week. Try to eat at least 2 vegetarian meals a week. Eat more home-cooked food and less restaurant, buffet, and fast food. Lifestyle  When eating at a restaurant, ask that your food be prepared with less salt or no salt, if possible. If you drink alcohol: Limit how much you use to: 0-1 drink a day for women who are not pregnant. 0-2 drinks a day for men. Be aware of how much alcohol is in your drink. In the [place] one  drink equals one 12 oz bottle of beer (355 mL), one 5 oz glass of wine (148 mL), or one 1 oz glass of hard liquor (44 mL). General information Avoid eating more than 2,300 mg of salt a day. If you have hypertension, you may need to reduce your sodium intake to 1,500 mg a day. Work with your health care provider to maintain a healthy body weight or to lose weight. Ask what an ideal weight is for you. Get at least 30 minutes of exercise that causes your heart to beat faster (aerobic exercise) most days of the week. Activities may include walking, swimming, or biking. Work with your health care provider or dietitian to adjust your eating plan to your individual calorie needs. What foods should I eat? Fruits All fresh, dried, or frozen fruit. Canned fruit in natural juice (without added sugar). Vegetables Fresh or frozen vegetables (raw, steamed, roasted, or grilled). Low-sodium or reduced-sodium tomato and vegetable juice. Low-sodium or reduced-sodium tomato sauce and tomato paste. Low-sodium or reduced-sodium canned vegetables. Grains Whole-grain or whole-wheat bread. Whole-grain or whole-wheat pasta. Brown rice. Orpah Cobb. Bulgur. Whole-grain and low-sodium cereals. Pita bread. Low-fat, low-sodium crackers. Whole-wheat flour tortillas. Meats and other proteins Skinless chicken or [place]. Ground chicken or [place]. Pork with fat trimmed off. Fish and seafood. Egg whites. Dried beans, peas, or lentils. Unsalted nuts, nut butters, and seeds. Unsalted canned beans. Lean cuts of beef with fat trimmed off. Low-sodium, lean precooked or cured meat, such as sausages or meat loaves. Dairy Low-fat (1%) or fat-free (skim) milk. Reduced-fat, low-fat, or fat-free cheeses. Nonfat, low-sodium ricotta or cottage cheese. Low-fat or nonfat yogurt. Low-fat, low-sodium cheese. Fats and oils Soft margarine without trans fats. Vegetable oil. Reduced-fat, low-fat, or light mayonnaise and salad dressings  (reduced-sodium). Canola, safflower, olive, avocado, soybean, and sunflower oils. Avocado. Seasonings and condiments Herbs. Spices. Seasoning mixes without salt. Other foods Unsalted popcorn and pretzels. Fat-free sweets. The items listed above may not be a complete list of foods and beverages you can eat. Contact a dietitian for more information. What foods should I avoid? Fruits Canned fruit in a light or heavy syrup. Fried fruit. Fruit in cream or butter sauce. Vegetables Creamed or fried vegetables. Vegetables in a cheese sauce. Regular canned vegetables (not low-sodium or reduced-sodium). Regular canned tomato sauce and paste (not low-sodium or reduced-sodium). Regular tomato and vegetable juice (not low-sodium or reduced-sodium). Rosita Fire. Olives. Grains Baked goods made with fat, such as croissants, muffins, or some breads. Dry pasta or rice meal packs. Meats and other proteins Fatty cuts of meat. Ribs. Fried meat. Tomasa Blase. Bologna, salami, and other precooked or cured meats, such as sausages or meat loaves. Fat from the back of a pig (fatback). Bratwurst. Salted nuts and seeds. Canned beans with added salt. Canned or smoked fish. Whole eggs or egg yolks. Chicken or [place] with skin. Dairy Whole or 2% milk, cream, and half-and-half. Whole or full-fat cream cheese. Whole-fat or sweetened yogurt. Full-fat cheese. Nondairy creamers. Whipped toppings. Processed cheese and cheese spreads. Fats and oils Butter. Stick margarine. Lard. Shortening. Ghee. Bacon fat. Tropical oils, such as coconut, palm kernel, or palm oil. Seasonings  and condiments Onion salt, garlic salt, seasoned salt, table salt, and sea salt. Worcestershire sauce. Tartar sauce. Barbecue sauce. Teriyaki sauce. Soy sauce, including reduced-sodium. Steak sauce. Canned and packaged gravies. Fish sauce. Oyster sauce. Cocktail sauce. Store-bought horseradish. Ketchup. Mustard. Meat flavorings and tenderizers. Bouillon cubes. Hot sauces.  Pre-made or packaged marinades. Pre-made or packaged taco seasonings. Relishes. Regular salad dressings. Other foods Salted popcorn and pretzels. The items listed above may not be a complete list of foods and beverages you should avoid. Contact a dietitian for more information. Where to find more information National Heart, Lung, and Blood Institute: PopSteam.is American Heart Association: www.heart.org Academy of Nutrition and Dietetics: www.eatright.org National Kidney Foundation: www.kidney.org Summary The DASH eating plan is a healthy eating plan that has been shown to reduce high blood pressure (hypertension). It may also reduce your risk for type 2 diabetes, heart disease, and stroke. When on the DASH eating plan, aim to eat more fresh fruits and vegetables, whole grains, lean proteins, low-fat dairy, and heart-healthy fats. With the DASH eating plan, you should limit salt (sodium) intake to 2,300 mg a day. If you have hypertension, you may need to reduce your sodium intake to 1,500 mg a day. Work with your health care provider or dietitian to adjust your eating plan to your individual calorie needs. This information is not intended to replace advice given to you by your health care provider. Make sure you discuss any questions you have with your health care provider. Document Revised: 07/14/2019 Document Reviewed: 07/14/2019 Elsevier Patient Education  2023 Elsevier Inc. Adopting a Healthy Lifestyle.   Weight: Know what a healthy weight is for you (roughly BMI <25) and aim to maintain this. You can calculate your body mass index on your smart phone  Diet: Aim for 7+ servings of fruits and vegetables daily Limit animal fats in diet for cholesterol and heart health - choose grass fed whenever available Avoid highly processed foods (fast food burgers, tacos, fried chicken, pizza, hot dogs, french fries)  Saturated fat comes in the form of butter, lard, coconut oil, margarine,  partially hydrogenated oils, and fat in meat. These increase your risk of cardiovascular disease.  Use healthy plant oils, such as olive, canola, soy, corn, sunflower and peanut.  Whole foods such as fruits, vegetables and whole grains have fiber  Men need > 38 grams of fiber per day Women need > 25 grams of fiber per day  Load up on vegetables and fruits - one-half of your plate: Aim for color and variety, and remember that potatoes dont count. Go for whole grains - one-quarter of your plate: Whole wheat, barley, wheat berries, quinoa, oats, brown rice, and foods made with them. If you want pasta, go with whole wheat pasta. Protein power - one-quarter of your plate: Fish, chicken, beans, and nuts are all healthy, versatile protein sources. Limit red meat. You need carbohydrates for energy! The type of carbohydrate is more important than the amount. Choose carbohydrates such as vegetables, fruits, whole grains, beans, and nuts in the place of white rice, white pasta, potatoes (baked or fried), macaroni and cheese, cakes, cookies, and donuts.  If youre thirsty, drink water. Coffee and tea are good in moderation, but skip sugary drinks and limit milk and dairy products to one or two daily servings. Keep sugar intake at 6 teaspoons or 24 grams or LESS       Exercise: Aim for 150 min of moderate intensity exercise weekly for heart health, and weights twice  weekly for bone health Stay active - any steps are better than no steps! Aim for 7-9 hours of sleep daily          Signed, Levi Aland, NP  12/01/2022 11:32 AM    [facility]

## 2022-12-01 ENCOUNTER — Ambulatory Visit: Payer: 59 | Attending: Nurse Practitioner | Admitting: Nurse Practitioner

## 2022-12-01 ENCOUNTER — Encounter: Payer: Self-pay | Admitting: Nurse Practitioner

## 2022-12-01 VITALS — BP 142/70 | HR 66 | Ht 71.0 in | Wt 205.0 lb

## 2022-12-01 DIAGNOSIS — I119 Hypertensive heart disease without heart failure: Secondary | ICD-10-CM

## 2022-12-01 DIAGNOSIS — E785 Hyperlipidemia, unspecified: Secondary | ICD-10-CM

## 2022-12-01 DIAGNOSIS — I2510 Atherosclerotic heart disease of native coronary artery without angina pectoris: Secondary | ICD-10-CM | POA: Diagnosis not present

## 2022-12-01 DIAGNOSIS — I517 Cardiomegaly: Secondary | ICD-10-CM

## 2022-12-01 NOTE — Patient Instructions (Signed)
Medication Instructions:   Your physician recommends that you continue on your current medications as directed. Please refer to the Current Medication list given to you today.   *If you need a refill on your cardiac medications before your next appointment, please call your pharmacy*   Lab Work:  TODAY!!!!! LIPID/DIRECT LDL/LFT  If you have labs (blood work) drawn today and your tests are completely normal, you will receive your results only by: MyChart Message (if you have MyChart) OR A paper copy in the mail If you have any lab test that is abnormal or we need to change your treatment, we will call you to review the results.   Testing/Procedures:  None ordered.   Follow-Up: At [facility] you and your health needs are our priority.  As part of our continuing mission to provide you with exceptional heart care, we have created designated Provider Care Teams.  These Care Teams include your primary Cardiologist (physician) and Advanced Practice Providers (APPs -  Physician Assistants and Nurse Practitioners) who all work together to provide you with the care you need, when you need it.  We recommend signing up for the patient portal called "MyChart".  Sign up information is provided on this After Visit Summary.  MyChart is used to connect with patients for Virtual Visits (Telemedicine).  Patients are able to view lab/test results, encounter notes, upcoming appointments, etc.  Non-urgent messages can be sent to your provider as well.   To learn more about what you can do with MyChart, go to [email or web address].    Your next appointment:   1 month(s)  Provider:   Eligha Bridegroom, NP         Other Instructions  Mediterranean Diet A Mediterranean diet refers to food and lifestyle choices that are based on the traditions of countries located on the [place]. It focuses on eating more fruits, vegetables, whole grains, beans, nuts, seeds, and heart-healthy fats,  and eating less dairy, meat, eggs, and processed foods with added sugar, salt, and fat. This way of eating has been shown to help prevent certain conditions and improve outcomes for people who have chronic diseases, like kidney disease and heart disease. What are tips for following this plan? Reading food labels Check the serving size of packaged foods. For foods such as rice and pasta, the serving size refers to the amount of cooked product, not dry. Check the total fat in packaged foods. Avoid foods that have saturated fat or trans fats. Check the ingredient list for added sugars, such as corn syrup. Shopping  Buy a variety of foods that offer a balanced diet, including: Fresh fruits and vegetables (produce). Grains, beans, nuts, and seeds. Some of these may be available in unpackaged forms or large amounts (in bulk). Fresh seafood. Poultry and eggs. Low-fat dairy products. Buy whole ingredients instead of prepackaged foods. Buy fresh fruits and vegetables in-season from local farmers markets. Buy plain frozen fruits and vegetables. If you do not have access to quality fresh seafood, buy precooked frozen shrimp or canned fish, such as tuna, salmon, or sardines. Stock your pantry so you always have certain foods on hand, such as olive oil, canned tuna, canned tomatoes, rice, pasta, and beans. Cooking Cook foods with extra-virgin olive oil instead of using butter or other vegetable oils. Have meat as a side dish, and have vegetables or grains as your main dish. This means having meat in small portions or adding small amounts of meat to foods like pasta or  stew. Use beans or vegetables instead of meat in common dishes like chili or lasagna. Experiment with different cooking methods. Try roasting, broiling, steaming, and sauting vegetables. Add frozen vegetables to soups, stews, pasta, or rice. Add nuts or seeds for added healthy fats and plant protein at each meal. You can add these to  yogurt, salads, or vegetable dishes. Marinate fish or vegetables using olive oil, lemon juice, garlic, and fresh herbs. Meal planning Plan to eat one vegetarian meal one day each week. Try to work up to two vegetarian meals, if possible. Eat seafood two or more times a week. Have healthy snacks readily available, such as: Vegetable sticks with hummus. Greek yogurt. Fruit and nut trail mix. Eat balanced meals throughout the week. This includes: Fruit: 2-3 servings a day. Vegetables: 4-5 servings a day. Low-fat dairy: 2 servings a day. Fish, poultry, or lean meat: 1 serving a day. Beans and legumes: 2 or more servings a week. Nuts and seeds: 1-2 servings a day. Whole grains: 6-8 servings a day. Extra-virgin olive oil: 3-4 servings a day. Limit red meat and sweets to only a few servings a month. Lifestyle  Cook and eat meals together with your family, when possible. Drink enough fluid to keep your urine pale yellow. Be physically active every day. This includes: Aerobic exercise like running or swimming. Leisure activities like gardening, walking, or housework. Get 7-8 hours of sleep each night. If recommended by your health care provider, drink red wine in moderation. This means 1 glass a day for nonpregnant women and 2 glasses a day for men. A glass of wine equals 5 oz (150 mL). What foods should I eat? Fruits Apples. Apricots. Avocado. Berries. Bananas. Cherries. Dates. Figs. Grapes. Lemons. Melon. Oranges. Peaches. Plums. Pomegranate. Vegetables Artichokes. Beets. Broccoli. Cabbage. Carrots. Eggplant. Green beans. Chard. Kale. Spinach. Onions. Leeks. Peas. Squash. Tomatoes. Peppers. Radishes. Grains Whole-grain pasta. Brown rice. Bulgur wheat. Polenta. Couscous. Whole-wheat bread. Orpah Cobb. Meats and other proteins Beans. Almonds. Sunflower seeds. Pine nuts. Peanuts. Cod. Salmon. Scallops. Shrimp. Tuna. Tilapia. Clams. Oysters. Eggs. Poultry without skin. Dairy Low-fat  milk. Cheese. Greek yogurt. Fats and oils Extra-virgin olive oil. Avocado oil. Grapeseed oil. Beverages Water. Red wine. Herbal tea. Sweets and desserts Greek yogurt with honey. Baked apples. Poached pears. Trail mix. Seasonings and condiments Basil. Cilantro. Coriander. Cumin. Mint. Parsley. Sage. Rosemary. Tarragon. Garlic. Oregano. Thyme. Pepper. Balsamic vinegar. Tahini. Hummus. Tomato sauce. Olives. Mushrooms. The items listed above may not be a complete list of foods and beverages you can eat. Contact a dietitian for more information. What foods should I limit? This is a list of foods that should be eaten rarely or only on special occasions. Fruits Fruit canned in syrup. Vegetables Deep-fried potatoes (french fries). Grains Prepackaged pasta or rice dishes. Prepackaged cereal with added sugar. Prepackaged snacks with added sugar. Meats and other proteins Beef. Pork. Lamb. Poultry with skin. Hot dogs. Tomasa Blase. Dairy Ice cream. Sour cream. Whole milk. Fats and oils Butter. Canola oil. Vegetable oil. Beef fat (tallow). Lard. Beverages Juice. Sugar-sweetened soft drinks. Beer. Liquor and spirits. Sweets and desserts Cookies. Cakes. Pies. Candy. Seasonings and condiments Mayonnaise. Pre-made sauces and marinades. The items listed above may not be a complete list of foods and beverages you should limit. Contact a dietitian for more information. Summary The Mediterranean diet includes both food and lifestyle choices. Eat a variety of fresh fruits and vegetables, beans, nuts, seeds, and whole grains. Limit the amount of red meat and sweets that you  eat. If recommended by your health care provider, drink red wine in moderation. This means 1 glass a day for nonpregnant women and 2 glasses a day for men. A glass of wine equals 5 oz (150 mL). This information is not intended to replace advice given to you by your health care provider. Make sure you discuss any questions you have with your  health care provider. Document Revised: 09/15/2019 Document Reviewed: 07/13/2019 Elsevier Patient Education  2023 Elsevier Inc. DASH Eating Plan DASH stands for Dietary Approaches to Stop Hypertension. The DASH eating plan is a healthy eating plan that has been shown to: Reduce high blood pressure (hypertension). Reduce your risk for type 2 diabetes, heart disease, and stroke. Help with weight loss. What are tips for following this plan? Reading food labels Check food labels for the amount of salt (sodium) per serving. Choose foods with less than 5 percent of the Daily Value of sodium. Generally, foods with less than 300 milligrams (mg) of sodium per serving fit into this eating plan. To find whole grains, look for the word "whole" as the first word in the ingredient list. Shopping Buy products labeled as "low-sodium" or "no salt added." Buy fresh foods. Avoid canned foods and pre-made or frozen meals. Cooking Avoid adding salt when cooking. Use salt-free seasonings or herbs instead of table salt or sea salt. Check with your health care provider or pharmacist before using salt substitutes. Do not fry foods. Cook foods using healthy methods such as baking, boiling, grilling, roasting, and broiling instead. Cook with heart-healthy oils, such as olive, canola, avocado, soybean, or sunflower oil. Meal planning  Eat a balanced diet that includes: 4 or more servings of fruits and 4 or more servings of vegetables each day. Try to fill one-half of your plate with fruits and vegetables. 6-8 servings of whole grains each day. Less than 6 oz (170 g) of lean meat, poultry, or fish each day. A 3-oz (85-g) serving of meat is about the same size as a deck of cards. One egg equals 1 oz (28 g). 2-3 servings of low-fat dairy each day. One serving is 1 cup (237 mL). 1 serving of nuts, seeds, or beans 5 times each week. 2-3 servings of heart-healthy fats. Healthy fats called omega-3 fatty acids are found in  foods such as walnuts, flaxseeds, fortified milks, and eggs. These fats are also found in cold-water fish, such as sardines, salmon, and mackerel. Limit how much you eat of: Canned or prepackaged foods. Food that is high in trans fat, such as some fried foods. Food that is high in saturated fat, such as fatty meat. Desserts and other sweets, sugary drinks, and other foods with added sugar. Full-fat dairy products. Do not salt foods before eating. Do not eat more than 4 egg yolks a week. Try to eat at least 2 vegetarian meals a week. Eat more home-cooked food and less restaurant, buffet, and fast food. Lifestyle When eating at a restaurant, ask that your food be prepared with less salt or no salt, if possible. If you drink alcohol: Limit how much you use to: 0-1 drink a day for women who are not pregnant. 0-2 drinks a day for men. Be aware of how much alcohol is in your drink. In the [place] one drink equals one 12 oz bottle of beer (355 mL), one 5 oz glass of wine (148 mL), or one 1 oz glass of hard liquor (44 mL). General information Avoid eating more than 2,300 mg of  salt a day. If you have hypertension, you may need to reduce your sodium intake to 1,500 mg a day. Work with your health care provider to maintain a healthy body weight or to lose weight. Ask what an ideal weight is for you. Get at least 30 minutes of exercise that causes your heart to beat faster (aerobic exercise) most days of the week. Activities may include walking, swimming, or biking. Work with your health care provider or dietitian to adjust your eating plan to your individual calorie needs. What foods should I eat? Fruits All fresh, dried, or frozen fruit. Canned fruit in natural juice (without added sugar). Vegetables Fresh or frozen vegetables (raw, steamed, roasted, or grilled). Low-sodium or reduced-sodium tomato and vegetable juice. Low-sodium or reduced-sodium tomato sauce and tomato paste. Low-sodium or  reduced-sodium canned vegetables. Grains Whole-grain or whole-wheat bread. Whole-grain or whole-wheat pasta. Brown rice. Orpah Cobb. Bulgur. Whole-grain and low-sodium cereals. Pita bread. Low-fat, low-sodium crackers. Whole-wheat flour tortillas. Meats and other proteins Skinless chicken or [place]. Ground chicken or [place]. Pork with fat trimmed off. Fish and seafood. Egg whites. Dried beans, peas, or lentils. Unsalted nuts, nut butters, and seeds. Unsalted canned beans. Lean cuts of beef with fat trimmed off. Low-sodium, lean precooked or cured meat, such as sausages or meat loaves. Dairy Low-fat (1%) or fat-free (skim) milk. Reduced-fat, low-fat, or fat-free cheeses. Nonfat, low-sodium ricotta or cottage cheese. Low-fat or nonfat yogurt. Low-fat, low-sodium cheese. Fats and oils Soft margarine without trans fats. Vegetable oil. Reduced-fat, low-fat, or light mayonnaise and salad dressings (reduced-sodium). Canola, safflower, olive, avocado, soybean, and sunflower oils. Avocado. Seasonings and condiments Herbs. Spices. Seasoning mixes without salt. Other foods Unsalted popcorn and pretzels. Fat-free sweets. The items listed above may not be a complete list of foods and beverages you can eat. Contact a dietitian for more information. What foods should I avoid? Fruits Canned fruit in a light or heavy syrup. Fried fruit. Fruit in cream or butter sauce. Vegetables Creamed or fried vegetables. Vegetables in a cheese sauce. Regular canned vegetables (not low-sodium or reduced-sodium). Regular canned tomato sauce and paste (not low-sodium or reduced-sodium). Regular tomato and vegetable juice (not low-sodium or reduced-sodium). Rosita Fire. Olives. Grains Baked goods made with fat, such as croissants, muffins, or some breads. Dry pasta or rice meal packs. Meats and other proteins Fatty cuts of meat. Ribs. Fried meat. Tomasa Blase. Bologna, salami, and other precooked or cured meats, such as sausages or  meat loaves. Fat from the back of a pig (fatback). Bratwurst. Salted nuts and seeds. Canned beans with added salt. Canned or smoked fish. Whole eggs or egg yolks. Chicken or [place] with skin. Dairy Whole or 2% milk, cream, and half-and-half. Whole or full-fat cream cheese. Whole-fat or sweetened yogurt. Full-fat cheese. Nondairy creamers. Whipped toppings. Processed cheese and cheese spreads. Fats and oils Butter. Stick margarine. Lard. Shortening. Ghee. Bacon fat. Tropical oils, such as coconut, palm kernel, or palm oil. Seasonings and condiments Onion salt, garlic salt, seasoned salt, table salt, and sea salt. Worcestershire sauce. Tartar sauce. Barbecue sauce. Teriyaki sauce. Soy sauce, including reduced-sodium. Steak sauce. Canned and packaged gravies. Fish sauce. Oyster sauce. Cocktail sauce. Store-bought horseradish. Ketchup. Mustard. Meat flavorings and tenderizers. Bouillon cubes. Hot sauces. Pre-made or packaged marinades. Pre-made or packaged taco seasonings. Relishes. Regular salad dressings. Other foods Salted popcorn and pretzels. The items listed above may not be a complete list of foods and beverages you should avoid. Contact a dietitian for more information. Where to find more information  National Heart, Lung, and Blood Institute: PopSteam.iswww.nhlbi.nih.gov American Heart Association: www.heart.org Academy of Nutrition and Dietetics: www.eatright.org National Kidney Foundation: www.kidney.org Summary The DASH eating plan is a healthy eating plan that has been shown to reduce high blood pressure (hypertension). It may also reduce your risk for type 2 diabetes, heart disease, and stroke. When on the DASH eating plan, aim to eat more fresh fruits and vegetables, whole grains, lean proteins, low-fat dairy, and heart-healthy fats. With the DASH eating plan, you should limit salt (sodium) intake to 2,300 mg a day. If you have hypertension, you may need to reduce your sodium intake to 1,500 mg a  day. Work with your health care provider or dietitian to adjust your eating plan to your individual calorie needs. This information is not intended to replace advice given to you by your health care provider. Make sure you discuss any questions you have with your health care provider. Document Revised: 07/14/2019 Document Reviewed: 07/14/2019 Elsevier Patient Education  2023 Elsevier Inc. Adopting a Healthy Lifestyle.   Weight: Know what a healthy weight is for you (roughly BMI <25) and aim to maintain this. You can calculate your body mass index on your smart phone  Diet: Aim for 7+ servings of fruits and vegetables daily Limit animal fats in diet for cholesterol and heart health - choose grass fed whenever available Avoid highly processed foods (fast food burgers, tacos, fried chicken, pizza, hot dogs, french fries)  Saturated fat comes in the form of butter, lard, coconut oil, margarine, partially hydrogenated oils, and fat in meat. These increase your risk of cardiovascular disease.  Use healthy plant oils, such as olive, canola, soy, corn, sunflower and peanut.  Whole foods such as fruits, vegetables and whole grains have fiber  Men need > 38 grams of fiber per day Women need > 25 grams of fiber per day  Load up on vegetables and fruits - one-half of your plate: Aim for color and variety, and remember that potatoes dont count. Go for whole grains - one-quarter of your plate: Whole wheat, barley, wheat berries, quinoa, oats, brown rice, and foods made with them. If you want pasta, go with whole wheat pasta. Protein power - one-quarter of your plate: Fish, chicken, beans, and nuts are all healthy, versatile protein sources. Limit red meat. You need carbohydrates for energy! The type of carbohydrate is more important than the amount. Choose carbohydrates such as vegetables, fruits, whole grains, beans, and nuts in the place of white rice, white pasta, potatoes (baked or fried), macaroni and  cheese, cakes, cookies, and donuts.  If youre thirsty, drink water. Coffee and tea are good in moderation, but skip sugary drinks and limit milk and dairy products to one or two daily servings. Keep sugar intake at 6 teaspoons or 24 grams or LESS       Exercise: Aim for 150 min of moderate intensity exercise weekly for heart health, and weights twice weekly for bone health Stay active - any steps are better than no steps! Aim for 7-9 hours of sleep daily

## 2022-12-02 LAB — LDL CHOLESTEROL, DIRECT: LDL Direct: 103 mg/dL — ABNORMAL HIGH (ref 0–99)

## 2022-12-02 LAB — LIPID PANEL
Chol/HDL Ratio: 3.3 ratio (ref 0.0–5.0)
Cholesterol, Total: 189 mg/dL (ref 100–199)
HDL: 58 mg/dL (ref >39)
LDL Chol Calc (NIH): 101 mg/dL — ABNORMAL HIGH (ref 0–99)
Triglycerides: 172 mg/dL — ABNORMAL HIGH (ref 0–149)
VLDL Cholesterol Cal: 30 mg/dL (ref 5–40)

## 2022-12-02 LAB — HEPATIC FUNCTION PANEL
ALT: 27 IU/L (ref 0–44)
AST: 25 IU/L (ref 0–40)
Albumin: 4.4 g/dL (ref 3.9–4.9)
Alkaline Phosphatase: 85 IU/L (ref 44–121)
Bilirubin Total: 0.6 mg/dL (ref 0.0–1.2)
Bilirubin, Direct: 0.15 mg/dL (ref 0.00–0.40)
Total Protein: 6.9 g/dL (ref 6.0–8.5)

## 2022-12-13 NOTE — Progress Notes (Deleted)
Cardiology Office Note:    Date:  12/13/2022   ID:  Luke Wheeler, DOB 22-Feb-1956, MRN [id number]  PCP:  Lindaann Pascal, [name]   [facility] Providers Cardiologist:  Charlton Haws, MD     Referring MD: Lindaann Pascal, [name]   Chief Complaint: hypertension  History of Present Illness:    Luke Wheeler is a very pleasant 67 y.o. male with a hx of HTN and asymmetric septal hypertrophy with no evidence of HOCM only, LVEF 56%by cardiac MRI October 2016 and October 2020 with septal thickness 14 mm, LVEF 56%.  Atypical chest pain April 2016 with normal Myoview, EF 63%. Does not like beta-blocker for HTN due to erectile dysfunction. Quit smoking October 2021. Lung cancer CT 07/26/2021 benign. Lipids have been followed by PCP.  He is retired, staying active Forensic psychologist. Wife works for division of [employer].   Last cardiology clinic visit was 04/22/2022 with Dr. Eden Emms.  ECG revealed inferior lateral biphasic T waves likely related to HTN/LVH. He reported some exertional chest discomfort, not tightness.  He underwent coronary CTA which revealed coronary calcium score 10.5 (47 percentile) with mild CAD, 25 to 49% stenosis ostial and mid distal RCA, proximal PLA, ostial RI.  Minimal stenosis less than 25% mid LAD, proximal LCx.   Seen in clinic by me on 10/08/22 for preoperative cardiac evaluation for upcoming hernia repair. He was rushing to get here and BP is quite elevated. Reports it was 140/90 mmHg at recent office visit with surgeon. Occasional palpitations that do not occur very often, most often when he is relaxed. Has shortness of breath only when playing long tennis matches. Had to stop tennis due to inguinal hernia. Admits that at times, he runs out of his medication. Returned from recent 3 week trip and ran out of medication for about one week. Does not limit dietary intake of sodium. He denies chest pain, lower extremity edema, fatigue, palpitations, melena, hematuria, hemoptysis,  diaphoresis, weakness, presyncope, syncope, orthopnea, and PND. He was deemed low risk for upcoming surgery and underwent hernia surgery on 10/27/22. Verapamil was increased to 180 mg daily and he was advised to follow-up in 2 to 3 months for blood pressure management.  Seen by  me again on 12/01/22 with elevated BP. Not exercising due to recent hernia surgery. Has a bulge in the site of the inguinal hernia repair which he reports has decreased slightly. He feels a pulling sensation when he is walking. Otherwise, he feeling well. Has not monitored BP. Admits to not taking antihypertensives daily, forgets and only takes about every other day. Admits that he eats out most meals because his wife travels for work. Frequent visits [employer] [place]. No chest pain, shortness of breath, lower extremity edema, fatigue, palpitations, melena, hematuria, hemoptysis, diaphoresis, weakness, presyncope, syncope, orthopnea, and PND.  Discussion about the dangers of uncontrolled hypertension.  He wanted to work on better medication consistency and return for soon follow-up. Also eating out a great deal,  can significantly improve diet. Also advised to limit sodium to 2000 mg or less daily. Lipid panel at that visit revealed LDL 101. Plan to discuss switching to a higher intensity statin at next visit.   Today, he is here    Past Medical History:  Diagnosis Date   Cataract    Chest pain    High cholesterol    Hx of cardiovascular stress test    ETT-Myoview 4/16:  No ischemia, EF 50%, normal wall motion, normal study  Hx of echocardiogram    Echo 11/15:  mod asymmetric septal hypertrophy, no LVOT gradient, EF 55%, no RWMA, Gr 1 DD, septal 16 mm, post wall 11.2 mm, no SAM, trivial MR, normal RVF, mild RAE (HCM variant vs LVH)   Hypertension    LVH (left ventricular hypertrophy)    non-obstructive HCM vs LVH on echo 06/2014   Snoring     Past Surgical History:  Procedure Laterality Date   CATARACT EXTRACTION,  BILATERAL     INGUINAL HERNIA REPAIR Right 10/27/2022   Procedure: OPEN RIGHT INGUINAL HERNIA REPAIR;  Surgeon: Berna Bue, MD;  Location: [facility];  Service: General;  Laterality: Right;   WISDOM TOOTH EXTRACTION      Current Medications: No outpatient medications have been marked as taking for the 12/24/22 encounter (Appointment) with Levi Aland, NP.     Allergies:   Patient has no known allergies.   Social History   Socioeconomic History   Marital status: Married    Spouse name: Not on file   Number of children: 4   Years of education: Not on file   Highest education level: Not on file  Occupational History   Not on file  Tobacco Use   Smoking status: Every Day    Types: Cigarettes   Smokeless tobacco: Never  Vaping Use   Vaping Use: Never used  Substance and Sexual Activity   Alcohol use: Yes    Comment: occas   Drug use: No   Sexual activity: Yes  Other Topics Concern   Not on file  Social History Narrative   Not on file   Social Determinants of Health   Financial Resource Strain: Not on file  Food Insecurity: Not on file  Transportation Needs: Not on file  Physical Activity: Not on file  Stress: Not on file  Social Connections: Not on file     Family History: The patient's family history includes Coronary artery disease in his father; Diabetes in his mother; Hyperlipidemia in his father; Stroke in his father. There is no history of Colon cancer, Esophageal cancer, Rectal cancer, or Stomach cancer.  ROS:   Please see the history of present illness.  All other systems reviewed and are negative.  Labs/Other Studies Reviewed:    The following studies were reviewed today:  Coronary CTA 05/11/22 IMPRESSION: 1. Mild CAD, 25-49% stenosis, CADRADS 2.   2. Coronary calcium score is 10.5, which places the patient in the 47th percentile for age and sex matched control.   3. Normal coronary origins with right dominance  Cardiac MR 06/02/19 1.  Asymmetric hypertrophy measuring up to 14mm in basal septum and lateral walls (7mm inferior wall), not meeting criteria for hypertrophic cardiomyopathy (<15 mm)   2. RV insertion site LGE, which can be seen in setting of elevated pulmonary pressures   3.  Normal LV size and systolic function (EF 56%)   4.  Normal RV size and systolic function (EF 53%)  Stress Test 12/04/14 Impression Exercise Capacity:  Good exercise capacity. BP Response:  Normal blood pressure response. Clinical Symptoms:  No significant symptoms noted. ECG Impression:  No significant ST segment change suggestive of ischemia. Comparison with Prior Nuclear Study: No previous nuclear study performed   Overall Impression:  Normal stress nuclear study.   LV Wall Motion: Low normal LV Function, EF 50%; NL Wall Motion   Recent Labs: 10/26/2022: BUN 16; Creatinine, Ser 1.05; Hemoglobin 13.8; Platelets 273; Potassium 3.9; Sodium 138 12/01/2022: ALT 27  Recent Lipid Panel    Component Value Date/Time   CHOL 189 12/01/2022 1112   TRIG 172 (H) 12/01/2022 1112   HDL 58 12/01/2022 1112   CHOLHDL 3.3 12/01/2022 1112   LDLCALC 101 (H) 12/01/2022 1112   LDLDIRECT 103 (H) 12/01/2022 1112     Risk Assessment/Calculations:       Physical Exam:    VS:  There were no vitals taken for this visit.    Wt Readings from Last 3 Encounters:  12/01/22 205 lb (93 kg)  10/27/22 194 lb 0.1 oz (88 kg)  10/26/22 196 lb (88.9 kg)     GEN:  Well nourished, well developed in no acute distress HEENT: Normal NECK: No JVD; No carotid bruits CARDIAC: RRR, no murmurs, rubs, gallops RESPIRATORY:  Clear to auscultation without rales, wheezing or rhonchi  ABDOMEN: Soft, non-tender, non-distended MUSCULOSKELETAL:  No edema; No deformity. 2+ pedal pulses, equal bilaterally SKIN: Warm and dry NEUROLOGIC:  Alert and oriented x 3 PSYCHIATRIC:  Normal affect   EKG:  EKG is not ordered today  No BP recorded.  {Refresh Note OR Click here  to enter BP  :1}***  Diagnoses:    No diagnosis found.   Assessment and Plan:     Hypertensive heart disease: BP is elevated today. Admits he takes his medications only about every other day.  Lengthy discussion about the potential harm from untreated hypertension. He would like to work on improved consistency with current medications and return for soon follow-up.  Encouraged low-sodium, heart healthy, mostly plant-based diet. Dietary information provided. Continue losartan and verapamil. Return in 1 month for medication titration.   LVH: Cardiac MRI 05/2019 with normal LV function, asymmetric hypertrophy measuring up to 14 mm and basal septum and lateral walls, does not meet criteria for hypertrophic cardiomyopathy, no gadolinium uptake other than RV insertion site. He is asymptomatic presently but has been less active recently due to inguinal hernia. Emphasized the importance of better BP control. Lengthy discussion about the importance of healthy dieat, avoiding sodium and processed foods, and medication compliance. Consider repeat echo if develops symptoms with exertion once he resumes exercise.   CAD without angina: Coronary CTA 04/2022 with coronary calcium score 10.5 (47th %), mild CAD mid-distal RCA (25-49%), ostial RI, <25% mid LAD and LCx. He denies chest pain, dyspnea, or other symptoms concerning for angina.  No indication for further ischemic evaluation at this time. Encouraged 150 minutes moderate intensity exercise each week along with heart healthy diet for secondary prevention.  Continue aspirin, verapamil, fluvastatin, losartan. Need to ensure LDL is below goal < 70.  Hyperlipidemia LDL goal < 70: No recent lipid panel to review.  We will recheck today. He is on fluvastatin. Would recommend high dose statin if LDL remains > 70.   Disposition: ***  Medication Adjustments/Labs and Tests Ordered: Current medicines are reviewed at length with the patient today.  Concerns regarding  medicines are outlined above.  No orders of the defined types were placed in this encounter.  No orders of the defined types were placed in this encounter.   There are no Patient Instructions on file for this visit.   Signed, Levi Aland, NP  12/13/2022 4:46 PM    [facility]

## 2022-12-24 ENCOUNTER — Ambulatory Visit: Payer: Medicare Other | Admitting: Nurse Practitioner

## 2023-03-01 NOTE — Progress Notes (Signed)
Cardiology Office Note:    Date:  03/03/2023   ID:  Luke Wheeler, DOB 11-Nov-1955, MRN [id number]  PCP:  Lindaann Pascal, [name]   [facility] Providers Cardiologist:  Charlton Haws, MD     Referring MD: Lindaann Pascal, [name]   Chief Complaint: hypertension  History of Present Illness:    Luke Wheeler is a very pleasant 67 y.o. male with a hx of HTN and asymmetric septal hypertrophy with no evidence of HOCM only, LVEF 56%by cardiac MRI October 2016 and October 2020 with septal thickness 14 mm, LVEF 56%.  Atypical chest pain April 2016 with normal Myoview, EF 63%. Does not like beta-blocker for HTN due to erectile dysfunction. Quit smoking October 2021. Lung cancer CT 07/26/2021 benign. Lipids have been followed by PCP.  Seen 04/22/2022 by Dr. Eden Emms with ECG revealed inferior lateral biphasic T waves likely related to HTN/LVH. He reported some exertional chest discomfort, not tightness. He underwent coronary CTA which revealed coronary calcium score 10.5 (47 percentile) with mild CAD, 25 to 49% stenosis ostial and mid distal RCA, proximal PLA, ostial RI. Minimal stenosis less than 25% mid LAD, proximal LCx.   Seen in clinic by me on 10/08/22 for preoperative cardiac evaluation for upcoming hernia repair. He was rushing to get here and BP is quite elevated. BP elevated at recent office visit with surgeon. Occasional palpitations that do not occur very often, most often when he is relaxed. Shortness of breath only when playing long tennis matches. Had to stop tennis due to inguinal hernia. Returned from recent 3 week trip and ran out of medication for about one week. Does not limit dietary intake of sodium. He was deemed low risk for upcoming surgery and underwent hernia surgery on 10/27/22. Verapamil was increased to 180 mg daily and he was advised to follow-up in 2 to 3 months for blood pressure management.  He returned to clinic on 12/01/22 and was seen by me. Reported feeling well. Had  not been able to resume exercise since hernia surgery. Has bulge in the site of the inguinal hernia repair which he reports has decreased slightly. Feels a pulling sensation when he is walking. Otherwise, he is feeling well. Has not monitored BP. Admits to not taking antihypertensives daily, forgets and only takes about every other day. Admits that he eats out most meals because his wife travels for work ([employer] [place]). No chest pain, shortness of breath, lower extremity edema, fatigue, palpitations, melena, hematuria, hemoptysis, diaphoresis, weakness, presyncope, syncope, orthopnea, or PND. He wanted to work on improved medication compliance, improving diet and exercise and return in 1 month for follow-up. LDL measured at that visit was 103 and he was advised that we should replace fluvastatin with a stronger statin.  Today, he is here alone for follow-up. Feels an occasional "tremor" in his chest and left eye twitching. Reports that he is working on quitting smoking (had previously reported that he quit). Still has pain in sight of inguinal hernia repair, going back to see surgeon soon, which limits his exercise. Push mows his lawn, takes about 1 hour. When asked about dietary improvements, he is vague. He does not like to cook and his wife travels frequently. Monitors BP at home only if he feels dizzy or has a headache. Does not recall those readings. He denies chest pain, shortness of breath, lower extremity edema, fatigue, palpitations, melena, hematuria, hemoptysis, diaphoresis, weakness, presyncope, syncope, orthopnea, and PND.  Past Medical History:  Diagnosis Date  Cataract    Chest pain    High cholesterol    Hx of cardiovascular stress test    ETT-Myoview 4/16:  No ischemia, EF 50%, normal wall motion, normal study   Hx of echocardiogram    Echo 11/15:  mod asymmetric septal hypertrophy, no LVOT gradient, EF 55%, no RWMA, Gr 1 DD, septal 16 mm, post wall 11.2 mm, no SAM, trivial MR,  normal RVF, mild RAE (HCM variant vs LVH)   Hypertension    LVH (left ventricular hypertrophy)    non-obstructive HCM vs LVH on echo 06/2014   Snoring     Past Surgical History:  Procedure Laterality Date   CATARACT EXTRACTION, BILATERAL     INGUINAL HERNIA REPAIR Right 10/27/2022   Procedure: OPEN RIGHT INGUINAL HERNIA REPAIR;  Surgeon: Berna Bue, MD;  Location: [facility];  Service: General;  Laterality: Right;   WISDOM TOOTH EXTRACTION      Current Medications: Current Meds  Medication Sig   Ascorbic Acid (VITAMIN C) 1000 MG tablet Take 1,000 mg by mouth 3 (three) times a week.   aspirin EC 81 MG EC tablet Take 1 tablet (81 mg total) by mouth daily. (Patient taking differently: Take 81 mg by mouth at bedtime.)   b complex vitamins capsule Take 1 capsule by mouth daily.   diclofenac Sodium (VOLTAREN) 1 % GEL Apply topically.   ELDERBERRY PO Take 2-3 tablets by mouth See admin instructions. Take 5 times weekly   fluvastatin (LESCOL) 20 MG capsule Take 1 capsule (20 mg total) by mouth daily. (Patient taking differently: Take 80 mg by mouth daily.)   losartan (COZAAR) 100 MG tablet Take 1 tablet (100 mg total) by mouth daily.   Multiple Vitamin (MULTIVITAMIN WITH MINERALS) TABS tablet Take 1 tablet by mouth daily.   Saw Palmetto, Serenoa repens, (SAW PALMETTO BERRY PO) Take 540 mg by mouth daily.   sildenafil (VIAGRA) 100 MG tablet Take 1 tablet (100 mg total) by mouth daily as needed for erectile dysfunction.   verapamil (CALAN-SR) 180 MG CR tablet Take 1 tablet (180 mg total) by mouth at bedtime.     Allergies:   Patient has no known allergies.   Social History   Socioeconomic History   Marital status: Married    Spouse name: Not on file   Number of children: 4   Years of education: Not on file   Highest education level: Not on file  Occupational History   Not on file  Tobacco Use   Smoking status: Every Day    Types: Cigarettes   Smokeless tobacco: Never  Vaping Use    Vaping Use: Never used  Substance and Sexual Activity   Alcohol use: Yes    Comment: occas   Drug use: No   Sexual activity: Yes  Other Topics Concern   Not on file  Social History Narrative   Not on file   Social Determinants of Health   Financial Resource Strain: Not on file  Food Insecurity: Not on file  Transportation Needs: Not on file  Physical Activity: Not on file  Stress: Not on file  Social Connections: Not on file     Family History: The patient's family history includes Coronary artery disease in his father; Diabetes in his mother; Hyperlipidemia in his father; Stroke in his father. There is no history of Colon cancer, Esophageal cancer, Rectal cancer, or Stomach cancer.  ROS:   Please see the history of present illness.  All other systems reviewed  and are negative.  Labs/Other Studies Reviewed:    The following studies were reviewed today:  Coronary CTA 05/11/22 IMPRESSION: 1. Mild CAD, 25-49% stenosis, CADRADS 2.   2. Coronary calcium score is 10.5, which places the patient in the 47th percentile for age and sex matched control.   3. Normal coronary origins with right dominance  Cardiac MR 06/02/19 1. Asymmetric hypertrophy measuring up to 14mm in basal septum and lateral walls (7mm inferior wall), not meeting criteria for hypertrophic cardiomyopathy (<15 mm)   2. RV insertion site LGE, which can be seen in setting of elevated pulmonary pressures   3.  Normal LV size and systolic function (EF 56%)   4.  Normal RV size and systolic function (EF 53%)  Stress Test 12/04/14 Impression Exercise Capacity:  Good exercise capacity. BP Response:  Normal blood pressure response. Clinical Symptoms:  No significant symptoms noted. ECG Impression:  No significant ST segment change suggestive of ischemia. Comparison with Prior Nuclear Study: No previous nuclear study performed   Overall Impression:  Normal stress nuclear study.   LV Wall Motion: Low  normal LV Function, EF 50%; NL Wall Motion   Recent Labs: 10/26/2022: BUN 16; Creatinine, Ser 1.05; Hemoglobin 13.8; Platelets 273; Potassium 3.9; Sodium 138 12/01/2022: ALT 27  Recent Lipid Panel    Component Value Date/Time   CHOL 189 12/01/2022 1112   TRIG 172 (H) 12/01/2022 1112   HDL 58 12/01/2022 1112   CHOLHDL 3.3 12/01/2022 1112   LDLCALC 101 (H) 12/01/2022 1112   LDLDIRECT 103 (H) 12/01/2022 1112     Risk Assessment/Calculations:       Physical Exam:    VS:  BP (!) 142/80   Pulse 72   Ht 5\' 11"  (1.803 m)   Wt 201 lb 12.8 oz (91.5 kg)   SpO2 98%   BMI 28.15 kg/m     Wt Readings from Last 3 Encounters:  03/03/23 201 lb 12.8 oz (91.5 kg)  12/01/22 205 lb (93 kg)  10/27/22 194 lb 0.1 oz (88 kg)     GEN:  Well nourished, well developed in no acute distress HEENT: Normal NECK: No JVD; No carotid bruits CARDIAC: RRR, no murmurs, rubs, gallops RESPIRATORY:  Clear to auscultation without rales, wheezing or rhonchi  ABDOMEN: Soft, non-tender, non-distended MUSCULOSKELETAL:  No edema; No deformity. 2+ pedal pulses, equal bilaterally SKIN: Warm and dry NEUROLOGIC:  Alert and oriented x 3 PSYCHIATRIC:  Normal affect   EKG:  EKG is not ordered today  HYPERTENSION CONTROL Vitals:   03/03/23 1120 03/03/23 1140  BP: (!) 130/90 (!) 142/80    The patient's blood pressure is elevated above target today.  In order to address the patient's elevated BP: Blood pressure will be monitored at home to determine if medication changes need to be made.     Diagnoses:    1. Hypertensive heart disease without heart failure   2. LVH (left ventricular hypertrophy)   3. Hyperlipidemia LDL goal <70   4. Tobacco abuse   5. Coronary artery disease involving native coronary artery of native heart without angina pectoris      Assessment and Plan:     Hypertensive heart disease: BP is elevated today and remains elevated on recheck. It has improved since I initially saw him in  February and we increased verapamil, but remains elevated. I emphasized the importance of good BP control in the setting of LVH. He does not seem to be concerned. Advised him to monitor home BP  consistently and report to [place] if majority are > 130/80. Encouraged low-sodium, heart healthy, mostly plant-based diet and 150 minutes moderate intensity exercise each week. Continue losartan and verapamil. Consider addition of thiazide diuretic or change losartan to more potent ARB if BP remains elevated.   LVH: Cardiac MRI 05/2019 with normal LV function, asymmetric hypertrophy measuring up to 14 mm and basal septum and lateral walls, does not meet criteria for hypertrophic cardiomyopathy, no gadolinium uptake other than RV insertion site. He denies shortness of breath, orthopnea, PND, edema. Not exercising on a consistent basis since hernia surgery. Emphasized the importance of better BP control. Lengthy discussion about the importance of healthy dieat, avoiding sodium and processed foods, and medication compliance. Consider repeat echo if develops symptoms with exertion once he resumes exercise.   CAD without angina: Coronary CTA 04/2022 with coronary calcium score 10.5 (47th %), mild CAD mid-distal RCA (25-49%), ostial RI, <25% mid LAD and LCx. He denies chest pain, dyspnea, or other symptoms concerning for angina.  No indication for further ischemic evaluation at this time. Encouraged 150 minutes moderate intensity exercise each week along with heart healthy diet for secondary prevention. Continue aspirin, verapamil, fluvastatin, losartan. Need to ensure LDL is below goal < 70.  Hyperlipidemia LDL goal < 70: LDL 103 on 6/0/45. Fluvastatin was increased  by PCP to 80 mg daily. I emphasized the importance of LDL goal less than 70. Reports he will be having repeat lipid testing by PCP.   Tobacco abuse: No cigarettes x 1 week, using nicotine gum. Had previously reported that he quit. Complete cessation advised.    Disposition: 6 months with Dr. Eden Emms   Medication Adjustments/Labs and Tests Ordered: Current medicines are reviewed at length with the patient today.  Concerns regarding medicines are outlined above.  No orders of the defined types were placed in this encounter.  No orders of the defined types were placed in this encounter.   Patient Instructions  Medication Instructions:  Your physician recommends that you continue on your current medications as directed. Please refer to the Current Medication list given to you today.  *If you need a refill on your cardiac medications before your next appointment, please call your pharmacy*  Lab Work: None ordered If you have labs (blood work) drawn today and your tests are completely normal, you will receive your results only by: MyChart Message (if you have MyChart) OR A paper copy in the mail If you have any lab test that is abnormal or we need to change your treatment, we will call you to review the results.  Follow-Up: At [facility], you and your health needs are our priority.  As part of our continuing mission to provide you with exceptional heart care, we have created designated Provider Care Teams.  These Care Teams include your primary Cardiologist (physician) and Advanced Practice Providers (APPs -  Physician Assistants and Nurse Practitioners) who all work together to provide you with the care you need, when you need it.  Your next appointment:   6 month(s)  Provider:   Charlton Haws, MD   Other Instructions Your physician has requested that you regularly monitor and record your blood pressure readings at home. Please use the same machine at the same time of day to check your readings and record them to bring to your follow-up visit.   Please monitor blood pressures and keep a log of your readings. Goal:Consistently less than 130/80   Make sure to check 2 hours after  your medications.    AVOID these things for 30  minutes before checking your blood pressure: No Drinking caffeine. No Drinking alcohol. No Eating. No Smoking. No Exercising.   Five minutes before checking your blood pressure: Pee. Sit in a dining chair. Avoid sitting in a soft couch or armchair. Be quiet. Do not talk     Signed, Levi Aland, NP  03/03/2023 1:15 PM    [facility]

## 2023-03-03 ENCOUNTER — Encounter: Payer: Self-pay | Admitting: Nurse Practitioner

## 2023-03-03 ENCOUNTER — Ambulatory Visit: Payer: Medicare Other | Attending: Nurse Practitioner | Admitting: Nurse Practitioner

## 2023-03-03 VITALS — BP 142/80 | HR 72 | Ht 71.0 in | Wt 201.8 lb

## 2023-03-03 DIAGNOSIS — I119 Hypertensive heart disease without heart failure: Secondary | ICD-10-CM | POA: Insufficient documentation

## 2023-03-03 DIAGNOSIS — Z720 Tobacco use: Secondary | ICD-10-CM | POA: Diagnosis present

## 2023-03-03 DIAGNOSIS — E785 Hyperlipidemia, unspecified: Secondary | ICD-10-CM | POA: Insufficient documentation

## 2023-03-03 DIAGNOSIS — I2510 Atherosclerotic heart disease of native coronary artery without angina pectoris: Secondary | ICD-10-CM | POA: Diagnosis not present

## 2023-03-03 DIAGNOSIS — I517 Cardiomegaly: Secondary | ICD-10-CM | POA: Diagnosis present

## 2023-03-03 NOTE — Patient Instructions (Signed)
Medication Instructions:  Your physician recommends that you continue on your current medications as directed. Please refer to the Current Medication list given to you today.  *If you need a refill on your cardiac medications before your next appointment, please call your pharmacy*  Lab Work: None ordered If you have labs (blood work) drawn today and your tests are completely normal, you will receive your results only by: MyChart Message (if you have MyChart) OR A paper copy in the mail If you have any lab test that is abnormal or we need to change your treatment, we will call you to review the results.  Follow-Up: At [facility], you and your health needs are our priority.  As part of our continuing mission to provide you with exceptional heart care, we have created designated Provider Care Teams.  These Care Teams include your primary Cardiologist (physician) and Advanced Practice Providers (APPs -  Physician Assistants and Nurse Practitioners) who all work together to provide you with the care you need, when you need it.  Your next appointment:   6 month(s)  Provider:   Charlton Haws, MD   Other Instructions Your physician has requested that you regularly monitor and record your blood pressure readings at home. Please use the same machine at the same time of day to check your readings and record them to bring to your follow-up visit.   Please monitor blood pressures and keep a log of your readings. Goal:Consistently less than 130/80   Make sure to check 2 hours after your medications.    AVOID these things for 30 minutes before checking your blood pressure: No Drinking caffeine. No Drinking alcohol. No Eating. No Smoking. No Exercising.   Five minutes before checking your blood pressure: Pee. Sit in a dining chair. Avoid sitting in a soft couch or armchair. Be quiet. Do not talk

## 2023-07-09 NOTE — Progress Notes (Deleted)
07/09/2023 Luke Wheeler   1956/03/30  [id number]  Primary Physician Long, [name], [name] Primary Cardiologist: Dr. Eden Emms    HPI: 67 y.o. HTN and asymmetric septal hypertrophy No evidence of HOCM by cardiac MRI October 2016 and 06/02/19 with septal thickness 14 mm only LVEF 56%  Atypical chest pain April 2016 with normal myovue EF 63% Does not like beta blocker for HTN due to erectile dysfunction Quit smoking October 2021 Takes Fluvastatin and last recorded LDL 103 dose increased by PCP to 80 mg   Two boys age 49/17  They are at day school Wife in [place] works for division of [employer]  He is retired  Chief Strategy Officer limited by hernia   [employer] Long PA. 2023 Had atypical SSCP Some exertional some not tightness in chest   Cardiac CTA 05/10/22 reviewed Calcium score 20.5 , 47 th percentile CAD RADS 2 25-49% IM and mid/distal RCA  Had hernia repair under general anesthesia 10/27/22   BP has been labile with some compliance issues and smoking again February NP increased verapamil and considered adding diuretic and changing to stronger ARB  ***  No outpatient medications have been marked as taking for the 07/21/23 encounter (Appointment) with Wendall Stade, MD.   No Known Allergies Past Medical History:  Diagnosis Date   Cataract    Chest pain    High cholesterol    Hx of cardiovascular stress test    ETT-Myoview 4/16:  No ischemia, EF 50%, normal wall motion, normal study   Hx of echocardiogram    Echo 11/15:  mod asymmetric septal hypertrophy, no LVOT gradient, EF 55%, no RWMA, Gr 1 DD, septal 16 mm, post wall 11.2 mm, no SAM, trivial MR, normal RVF, mild RAE (HCM variant vs LVH)   Hypertension    LVH (left ventricular hypertrophy)    non-obstructive HCM vs LVH on echo 06/2014   Snoring    Family History  Problem Relation Age of Onset   Diabetes Mother    Hyperlipidemia Father    Stroke Father    Coronary artery disease Father    Colon cancer Neg Hx    Esophageal  cancer Neg Hx    Rectal cancer Neg Hx    Stomach cancer Neg Hx    Past Surgical History:  Procedure Laterality Date   CATARACT EXTRACTION, BILATERAL     INGUINAL HERNIA REPAIR Right 10/27/2022   Procedure: OPEN RIGHT INGUINAL HERNIA REPAIR;  Surgeon: Berna Bue, MD;  Location: [facility];  Service: General;  Laterality: Right;   WISDOM TOOTH EXTRACTION     Social History   Socioeconomic History   Marital status: Married    Spouse name: Not on file   Number of children: 4   Years of education: Not on file   Highest education level: Not on file  Occupational History   Not on file  Tobacco Use   Smoking status: Every Day    Types: Cigarettes   Smokeless tobacco: Never  Vaping Use   Vaping status: Never Used  Substance and Sexual Activity   Alcohol use: Yes    Comment: occas   Drug use: No   Sexual activity: Yes  Other Topics Concern   Not on file  Social History Narrative   Not on file   Social Determinants of Health   Financial Resource Strain: Not on file  Food Insecurity: Not on file  Transportation Needs: Not on file  Physical Activity: Not on file  Stress: Not on  file  Social Connections: Unknown (03/02/2023)   Received from [facility], [facility]   Social Network    Social Network: Not on file  Intimate Partner Violence: Unknown (03/02/2023)   Received from [facility], [facility]   HITS    Physically Hurt: Not on file    Insult or Talk Down To: Not on file    Threaten Physical Harm: Not on file    Scream or Curse: Not on file     Review of Systems: General: negative for chills, fever, night sweats or weight changes.  Cardiovascular: negative for chest pain, dyspnea on exertion, edema, orthopnea, palpitations, paroxysmal nocturnal dyspnea or shortness of breath Dermatological: negative for rash Respiratory: negative for cough or wheezing Urologic: negative for hematuria Abdominal: negative for nausea, vomiting, diarrhea, bright red blood per  rectum, melena, or hematemesis Neurologic: negative for visual changes, syncope, or dizziness All other systems reviewed and are otherwise negative except as noted above.   Physical Exam:  There were no vitals taken for this visit.  Affect appropriate Healthy:  appears stated age HEENT: normal Neck supple with no adenopathy JVP normal no bruits no thyromegaly Lungs clear with no wheezing and good diaphragmatic motion Heart:  S1/S2 no murmur, no rub, gallop or click PMI normal Abdomen: benighn, BS positve, no tenderness, no AAA no bruit.  No HSM or HJR Distal pulses intact with no bruits No edema Neuro non-focal Skin warm and dry No muscular weakness   EKG   07/09/2023   SR rate 60 biphasic T wave changes inferior lateral leads chronic 07/09/2023 SR rate 52 chronically abnormal T wave changes laterally   ASSESSMENT AND PLAN:   1. Hypertensive heart disease: abnormal ECG concern for HOCM but MRI with no gadolinium uptake in 2016  Septal thickness only 14 mm MRI 06/02/19 and again no gadolinium uptake other than RV insertion site ***   2. Hypertension:  ***   3. Tobacco abuse: Quit smoking 05/2020 but resumed  Lung cancer CT 07/26/21 benign CXR ok 10/27/22 update CT  4. Hyperlipidemia: followed by primary statin dose increased ***   5. Erectile dysfunction: should be provided by primary Viagra dose 100 mg tab usual dose   6. Abnormal ECG: inferolateral biphasic T waves likely related to HTN/LVH  7.  Chest Pain:  shared decision making favor cardiac CTA 50 gm lopressor 3 hours before   Lung cancer CT ***   F/U cardiology in a year   [employer]

## 2023-07-21 ENCOUNTER — Ambulatory Visit: Payer: Medicare Other | Admitting: Cardiovascular Disease

## 2023-10-01 NOTE — Progress Notes (Deleted)
 10/01/2023 [name]   08/28/1955  [id number]  Primary Physician [name], Luke Picket, [name] Primary Cardiologist: Dr. Eden Wheeler    HPI: 68 y.o. 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 Had atypical SSCP Some exertional some not tightness in chest   Cardiac CTA 05/11/22 reviewed calcium score only 10.5 , 47 th percentile Mild CAD RADS 2 25-49% RCA/IM stenosis  10/27/22 Had right inguinal hernia repair   ***     No outpatient medications have been marked as taking for the 10/14/23 encounter (Appointment) with Luke Stade, MD.   No Known Allergies Past Medical History:  Diagnosis Date   Cataract    Chest pain    High cholesterol    Hx of cardiovascular stress test    ETT-Myoview 4/16:  No ischemia, EF 50%, normal wall motion, normal study   Hx of echocardiogram    Echo 11/15:  mod asymmetric septal hypertrophy, no LVOT gradient, EF 55%, no RWMA, Gr 1 DD, septal 16 mm, post wall 11.2 mm, no SAM, trivial MR, normal RVF, mild RAE (HCM variant vs LVH)   Hypertension    LVH (left ventricular hypertrophy)    non-obstructive HCM vs LVH on echo 06/2014   Snoring    Family History  Problem Relation Age of Onset   Diabetes Mother    Hyperlipidemia Father    Stroke Father    Coronary artery disease Father    Colon cancer Neg Hx    Esophageal cancer Neg Hx    Rectal cancer Neg Hx    Stomach cancer Neg Hx    Past Surgical History:  Procedure Laterality Date   CATARACT EXTRACTION, BILATERAL     INGUINAL HERNIA REPAIR Right  10/27/2022   Procedure: OPEN RIGHT INGUINAL HERNIA REPAIR;  Surgeon: Berna Bue, MD;  Location: [facility];  Service: General;  Laterality: Right;   WISDOM TOOTH EXTRACTION     Social History   Socioeconomic History   Marital status: Married    Spouse name: Not on file   Number of children: 4   Years of education: Not on file   Highest education level: Not on file  Occupational History   Not on file  Tobacco Use   Smoking status: Every Day    Types: Cigarettes   Smokeless tobacco: Never  Vaping Use   Vaping status: Never Used  Substance and Sexual Activity   Alcohol use: Yes    Comment: occas   Drug use: No   Sexual activity: Yes  Other Topics Concern   Not on file  Social History Narrative   Not on file   Social Drivers of Health   Financial Resource Strain: Not on file  Food Insecurity: Not on file  Transportation Needs: Not on file  Physical Activity: Not on file  Stress: Not on file  Social Connections: Unknown (03/02/2023)   Received from [facility], [facility]   Social Network    Social Network: Not on file  Intimate Partner  Violence: Unknown (03/02/2023)   Received from [facility], [facility]   HITS    Physically Hurt: Not on file    Insult or Talk Down To: Not on file    Threaten Physical Harm: Not on file    Scream or Curse: Not on file     Review of Systems: General: negative for chills, fever, night sweats or weight changes.  Cardiovascular: negative for chest pain, dyspnea on exertion, edema, orthopnea, palpitations, paroxysmal nocturnal dyspnea or shortness of breath Dermatological: negative for rash Respiratory: negative for cough or wheezing Urologic: negative for hematuria Abdominal: negative for nausea, vomiting, diarrhea, bright red blood per rectum, melena, or hematemesis Neurologic: negative for visual changes, syncope, or dizziness All other systems reviewed and are otherwise negative except as noted above.   Physical Exam:   There were no vitals taken for this visit.  Affect appropriate Healthy:  appears stated age HEENT: normal Neck supple with no adenopathy JVP normal no bruits no thyromegaly Lungs clear with no wheezing and good diaphragmatic motion Heart:  S1/S2 no murmur, no rub, gallop or click PMI normal Abdomen: benighn, BS positve, no tenderness, no AAA no bruit.  No HSM or HJR right inguinal hernia repair  Distal pulses intact with no bruits No edema Neuro non-focal Skin warm and dry No muscular weakness   EKG   10/01/2023   SR rate 60 biphasic T wave changes inferior lateral leads chronic 10/01/2023 SR rate 52 chronically abnormal T wave changes laterally   ASSESSMENT AND PLAN:   1. Hypertensive heart disease: abnormal ECG concern for HOCM but MRI with no gadolinium uptake in 2016  Septal thickness only 14 mm MRI 06/02/19 and again no gadolinium uptake other than RV insertion site    2. Hypertension:  Well controlled.  Continue current medications and low sodium Dash type diet.    3. 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 Hyperlipidemia: on statin therapy. Lipid profile is followed by his PCP. Patient advised to get upcomming fasting labs faxed to our office for review.   5. Erectile dysfunction: should be provided by primary Viagra dose 100 mg tab usual dose   6. Abnormal ECG: inferolateral biphasic T waves likely related to HTN/LVH  7.  Chest Pain:  non cardiac see above regarding cardiac CTA 05/11/22   Lung cancer CT ***   F/U cardiology in a year   Luke Wheeler

## 2023-10-13 ENCOUNTER — Other Ambulatory Visit: Payer: Self-pay | Admitting: Nurse Practitioner

## 2023-10-14 ENCOUNTER — Ambulatory Visit: Payer: Medicare Other | Admitting: Cardiovascular Disease

## 2023-10-14 NOTE — Progress Notes (Unsigned)
 10/15/2023 Luke Wheeler   10-20-1955  [id number]  Primary Physician Long, [name], [name] Primary Cardiologist: Dr. Eden Emms    HPI: 68 y.o. 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 Had atypical SSCP Some exertional some not tightness in chest   Cardiac CTA 05/11/22 reviewed calcium score only 10.5 , 47 th percentile Mild CAD RADS 2 25-49% RCA/IM stenosis  10/27/22 Had right inguinal hernia repair    Current Meds  Medication Sig   Ascorbic Acid (VITAMIN C) 1000 MG tablet Take 1,000 mg by mouth 3 (three) times a week.   aspirin EC 81 MG EC tablet Take 1 tablet (81 mg total) by mouth daily. (Patient taking differently: Take 81 mg by mouth at bedtime.)   b complex vitamins capsule Take 1 capsule by mouth daily.   diclofenac Sodium (VOLTAREN) 1 % GEL Apply topically.   ELDERBERRY PO Take 2-3 tablets by mouth See admin instructions. Take 5 times weekly   fluvastatin XL (LESCOL XL) 80 MG 24 hr tablet Take 80 mg by mouth daily.   Multiple Vitamin (MULTIVITAMIN WITH MINERALS) TABS tablet Take 1 tablet by mouth daily.   olmesartan (BENICAR) 40 MG tablet Take 40 mg by mouth daily.   Saw Palmetto, Serenoa repens, (SAW PALMETTO BERRY PO) Take 540 mg by mouth daily.   sildenafil (VIAGRA) 100 MG tablet Take 1 tablet (100 mg total) by mouth daily as needed for erectile dysfunction.   verapamil (CALAN-SR)  180 MG CR tablet TAKE 1 TABLET(180 MG) BY MOUTH AT BEDTIME   No Known Allergies Past Medical History:  Diagnosis Date   Cataract    Chest pain    High cholesterol    Hx of cardiovascular stress test    ETT-Myoview 4/16:  No ischemia, EF 50%, normal wall motion, normal study   Hx of echocardiogram    Echo 11/15:  mod asymmetric septal hypertrophy, no LVOT gradient, EF 55%, no RWMA, Gr 1 DD, septal 16 mm, post wall 11.2 mm, no SAM, trivial MR, normal RVF, mild RAE (HCM variant vs LVH)   Hypertension    LVH (left ventricular hypertrophy)    non-obstructive HCM vs LVH on echo 06/2014   Snoring    Family History  Problem Relation Age of Onset   Diabetes Mother    Hyperlipidemia Father    Stroke Father    Coronary artery disease Father    Colon cancer Neg Hx    Esophageal cancer Neg Hx    Rectal cancer Neg Hx    Stomach cancer Neg Hx    Past Surgical History:  Procedure Laterality Date   CATARACT EXTRACTION, BILATERAL     INGUINAL HERNIA REPAIR Right 10/27/2022   Procedure: OPEN RIGHT INGUINAL HERNIA REPAIR;  Surgeon: Berna Bue, MD;  Location: [facility];  Service: General;  Laterality:  Right;   WISDOM TOOTH EXTRACTION     Social History   Socioeconomic History   Marital status: Married    Spouse name: Not on file   Number of children: 4   Years of education: Not on file   Highest education level: Not on file  Occupational History   Not on file  Tobacco Use   Smoking status: Every Day    Types: Cigarettes   Smokeless tobacco: Never  Vaping Use   Vaping status: Never Used  Substance and Sexual Activity   Alcohol use: Yes    Comment: occas   Drug use: No   Sexual activity: Yes  Other Topics Concern   Not on file  Social History Narrative   Not on file   Social Drivers of Health   Financial Resource Strain: Not on file  Food Insecurity: Not on file  Transportation Needs: Not on file  Physical Activity: Not on file  Stress: Not on file  Social Connections:  Unknown (03/02/2023)   Received from [facility]   Social Network    Social Network: Not on file  Intimate Partner Violence: Unknown (03/02/2023)   Received from [employer] [facility], [facility]   HITS    Physically Hurt: Not on file    Insult or Talk Down To: Not on file    Threaten Physical Harm: Not on file    Scream or Curse: Not on file     Review of Systems: General: negative for chills, fever, night sweats or weight changes.  Cardiovascular: negative for chest pain, dyspnea on exertion, edema, orthopnea, palpitations, paroxysmal nocturnal dyspnea or shortness of breath Dermatological: negative for rash Respiratory: negative for cough or wheezing Urologic: negative for hematuria Abdominal: negative for nausea, vomiting, diarrhea, bright red blood per rectum, melena, or hematemesis Neurologic: negative for visual changes, syncope, or dizziness All other systems reviewed and are otherwise negative except as noted above.   Physical Exam:  Blood pressure 138/76, pulse 66, height 5\' 11"  (1.803 m), weight 208 lb 6.4 oz (94.5 kg), SpO2 95%.  Affect appropriate Healthy:  appears stated age HEENT: normal Neck supple with no adenopathy JVP normal no bruits no thyromegaly Lungs clear with no wheezing and good diaphragmatic motion Heart:  S1/S2 no murmur, no rub, gallop or click PMI normal Abdomen: benighn, BS positve, no tenderness, no AAA no bruit.  No HSM or HJR right inguinal hernia repair  Distal pulses intact with no bruits No edema Neuro non-focal Skin warm and dry No muscular weakness   EKG   10/15/2023   SR rate 60 biphasic T wave changes inferior lateral leads chronic 10/15/2023 SR rate 52 chronically abnormal T wave changes laterally   ASSESSMENT AND PLAN:   1. Hypertensive heart disease: abnormal ECG concern for HOCM but MRI with no gadolinium uptake in 2016  Septal thickness only 14 mm MRI 06/02/19 and again no gadolinium uptake other than RV insertion site     2. Hypertension:  Well controlled.  Continue current medications and low sodium Dash type diet.    3. 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 Hyperlipidemia: on statin therapy. Lipid profile is followed by his PCP. Patient advised to get upcomming fasting labs faxed to our office for review.   5. Erectile dysfunction: should be provided by primary Viagra dose 100 mg tab usual dose  6. Abnormal ECG: inferolateral biphasic T waves likely related to HTN/LVH  7.  Chest Pain:  non cardiac see above regarding cardiac CTA 05/11/22   Lung cancer CT    F/U cardiology in a year   Charlton Haws

## 2023-10-15 ENCOUNTER — Encounter: Payer: Self-pay | Admitting: Cardiovascular Disease

## 2023-10-15 ENCOUNTER — Ambulatory Visit: Payer: 59 | Attending: Cardiovascular Disease | Admitting: Cardiovascular Disease

## 2023-10-15 VITALS — BP 138/76 | HR 66 | Ht 71.0 in | Wt 208.4 lb

## 2023-10-15 DIAGNOSIS — Z720 Tobacco use: Secondary | ICD-10-CM | POA: Diagnosis not present

## 2023-10-15 DIAGNOSIS — I119 Hypertensive heart disease without heart failure: Secondary | ICD-10-CM | POA: Diagnosis not present

## 2023-10-15 DIAGNOSIS — I2510 Atherosclerotic heart disease of native coronary artery without angina pectoris: Secondary | ICD-10-CM | POA: Diagnosis not present

## 2023-10-15 DIAGNOSIS — I517 Cardiomegaly: Secondary | ICD-10-CM

## 2023-10-15 NOTE — Patient Instructions (Signed)
 Medication Instructions:  Your physician recommends that you continue on your current medications as directed. Please refer to the Current Medication list given to you today.  *If you need a refill on your cardiac medications before your next appointment, please call your pharmacy*  Lab Work: If you have labs (blood work) drawn today and your tests are completely normal, you will receive your results only by: MyChart Message (if you have MyChart) OR A paper copy in the mail If you have any lab test that is abnormal or we need to change your treatment, we will call you to review the results.   Testing/Procedures: Non-Cardiac CT scanning cancer screening, (CAT scanning), is a noninvasive, special x-ray that produces cross-sectional images of the body using x-rays and a computer. CT scans help physicians diagnose and treat medical conditions. For some CT exams, a contrast material is used to enhance visibility in the area of the body being studied. CT scans provide greater clarity and reveal more details than regular x-ray exams.  Follow-Up: At [facility], you and your health needs are our priority.  As part of our continuing mission to provide you with exceptional heart care, we have created designated Provider Care Teams.  These Care Teams include your primary Cardiologist (physician) and Advanced Practice Providers (APPs -  Physician Assistants and Nurse Practitioners) who all work together to provide you with the care you need, when you need it.  We recommend signing up for the patient portal called "MyChart".  Sign up information is provided on this After Visit Summary.  MyChart is used to connect with patients for Virtual Visits (Telemedicine).  Patients are able to view lab/test results, encounter notes, upcoming appointments, etc.  Non-urgent messages can be sent to your provider as well.   To learn more about what you can do with MyChart, go to [email or web address].    Your  next appointment:   1 year(s)  Provider:   Charlton Haws, MD     Other Instructions       1st Floor: - Lobby - Registration  - Pharmacy  - Lab - Cafe  2nd Floor: - PV Lab - Diagnostic Testing (echo, CT, nuclear med)  3rd Floor: - Vacant  4th Floor: - TCTS (cardiothoracic surgery) - AFib Clinic - Structural Heart Clinic - Vascular Surgery  - Vascular Ultrasound  5th Floor: - HeartCare Cardiology (general and EP) - Clinical Pharmacy for coumadin, hypertension, lipid, weight-loss medications, and med management appointments    Valet parking services will be available as well.

## 2023-11-09 ENCOUNTER — Ambulatory Visit
Admission: RE | Admit: 2023-11-09 | Discharge: 2023-11-09 | Disposition: A | Discharge status: Home / Self Care | Payer: Medicare Other | Source: Ambulatory Visit | Attending: Cardiovascular Disease | Admitting: Cardiovascular Disease

## 2023-11-09 DIAGNOSIS — I2510 Atherosclerotic heart disease of native coronary artery without angina pectoris: Secondary | ICD-10-CM

## 2023-11-09 DIAGNOSIS — Z720 Tobacco use: Secondary | ICD-10-CM

## 2023-11-09 DIAGNOSIS — I119 Hypertensive heart disease without heart failure: Secondary | ICD-10-CM

## 2023-11-09 DIAGNOSIS — I517 Cardiomegaly: Secondary | ICD-10-CM

## 2024-02-10 NOTE — Progress Notes (Signed)
 Cardiology Office Note   Date:  02/22/2024  ID:  Luke Wheeler, DOB [birth year], MRN [id number] PCP: [name], [name]  [facility] Providers Cardiologist:  [name], MD   History of Present Illness Luke Wheeler is a 68 y.o. 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 Patient followed by Dr. Emmer, presents today for evaluation of palpitations.   Patient previously underwent nuclear stress test in 11/2014 that was a normal study. Cardiac MRI in 05/2015 showed no morphologic evidence of apical or mid hypertrophic cardiomyopathy, EF 63%, normal valves, normal RV, no delayed hyperenhancement post gadolinium. Cardiac MRI in 05/2019 showed asymmetric hypertrophy measuring up to 14 mm in basal septum and lateral walls (7 mm), not meeting criteria for hypertrophic cardiomyopathy. Also showed normal LV size and systolic function, normal RV size and systolic function.   Coronary CTA in 04/2022 showed mild CAD, coronary calcium score of 10.5 (47th percentile), normal coronary origins with right dominance.   Patient last seen by Dr. Emmer on 10/15/23. At that time, patient was doing well.   Today, patient presents for evaluation of palpitations.  Reports that at times, he feels a fluttering in his chest.  Reports being aware of his heartbeat.  He denies any shortness of breath, dizziness, near syncope with these episodes.  He has been having occasional dizziness when he bends over and stands up too quickly.  Reports that this only happens sometimes and does not a constant concern.  He denies chest pain or shortness of breath.  Stays active by playing tennis.  Studies Reviewed Cardiac Studies & Procedures   ______________________________________________________________________________________________          CT SCANS  CT CORONARY MORPH W/CTA COR W/SCORE 05/08/2022  Addendum 05/11/2022  7:56 AM ADDENDUM  REPORT: 05/11/2022 07:54  EXAM: OVER-READ INTERPRETATION  CT CHEST  The following report is an over-read performed by radiologist Dr. [name] on 05/11/2022. This over-read does not include interpretation of cardiac or coronary anatomy or pathology. The coronary CTA interpretation by the cardiologist is attached.  COMPARISON:  CT scan 07/24/2021  FINDINGS: Normal caliber ascending thoracic aorta.  No dissection.  No mediastinal or hilar mass or lymphadenopathy. The esophagus is unremarkable.  No acute pulmonary findings or worrisome pulmonary lesions. Emphysematous changes are noted. No pleural effusion.  No significant upper abdominal findings.  No significant bony findings.  IMPRESSION: Emphysematous changes but no other significant extracardiac findings.  Emphysema (ICD10-J43.9).   Electronically Signed By: MYRTIS Stammer M.D. On: 05/11/2022 07:54  Narrative HISTORY: Chest pain, nonspecific Pericardial disease suspected  EXAM: Cardiac/Coronary  CT  TECHNIQUE: The patient was scanned on a [employer].  PROTOCOL: A 120 kV prospective scan was triggered in the descending thoracic aorta at 111 HU's. Axial non-contrast 3 mm slices were carried out through the heart. The data set was analyzed on a dedicated work station and scored using the Agatston method. Gantry rotation speed was 250 msecs and collimation was .6 mm. Beta blockade and 0.8 mg of sl NTG was given. The 3D data set was reconstructed in 5% intervals of the 35-75 % of the R-R cycle. Systolic and diastolic phases were analyzed on a dedicated work station using MPR, MIP and VRT modes. The patient received contrast: 100mL OMNIPAQUE  IOHEXOL  350 MG/ML SOLN.  FINDINGS: Image quality: Good  Noise artifact is: Mild slab artifact  Coronary calcium score is 10.5, which places the patient in the 47th percentile  for age and sex matched control.  Coronary arteries:  Normal coronary origins.  Right dominance.  Right Coronary Artery: Mild atherosclerotic plaque in the ostial and mid-distal RCA, 25-49% stenosis, as well as the proximal PLA.  Left Main Coronary Artery: No detectable plaque or stenosis.  Left Anterior Descending Coronary Artery: Mixed atherosclerotic plaque in the mid LAD with positive remodeling and minimal stenosis, <25% stenosis.  Ramus intermedius: Mild atherosclerotic plaque in the ostial RI, 25-49% stenosis.  Left Circumflex Artery: Minimal atherosclerotic plaque in the proximal LCx, <25% stenosis.  Aorta: Normal size, 34 mm at the mid ascending aorta (level of the PA bifurcation) measured double oblique. Trivial aortic root calcifications. No dissection.  Aortic Valve: Trivial calcifications.  Other findings:  Normal pulmonary vein drainage into the left atrium.  Normal left atrial appendage without thrombus.  Normal size of the pulmonary artery.  Please see separate report from [facility] for non-cardiac findings.  IMPRESSION: 1. Mild CAD, 25-49% stenosis, CADRADS 2.  2. Coronary calcium score is 10.5, which places the patient in the 47th percentile for age and sex matched control.  3. Normal coronary origins with right dominance.  RECOMMENDATIONS: CAD-RADS 2. Mild non-obstructive CAD (25-49%). Consider non-atherosclerotic causes of chest pain. Consider preventive therapy and risk factor modification.  Electronically Signed: By: [name] M.D. On: 05/10/2022 21:04   CARDIAC MRI  MR CARDIAC MORPHOLOGY W WO CONTRAST 06/02/2019  Narrative CLINICAL DATA:  Eval for HCM. Prior CMR 05/2015 with no evidence of HCM  EXAM: CARDIAC MRI  TECHNIQUE: The patient was scanned on a 1.5 Tesla Siemens magnet. A dedicated cardiac coil was used. Functional imaging was done using Fiesta sequences. 2,3, and 4 chamber views were done to assess for RWMA's. Modified Simpson's rule using a short axis  stack was used to calculate an ejection fraction on a dedicated work [employer]. The patient received 10 cc of Gadavist . After 10 minutes inversion recovery sequences were used to assess for infiltration and scar tissue.  CONTRAST:  10 cc  of Gadavist   FINDINGS: Left ventricle:  - Normal size  - Normal systolic function  - Asymmetric hypertrophy measuring up to 14mm in basal septum and lateral walls (7mm inferior wall)  - RV insertion site LGE  LV EF: 56% (Normal 56-78%)  Absolute volumes:  LV EDV: (Normal 77-195 mL)  LV ESV: 62mL (Normal 19-72 mL)  LV SV: 77mL (Normal 51-133 mL)  CO: 4.3L/min (Normal 2.8-8.8 L/min)  Indexed volumes:  LV EDV: 80mL/sq-m (Normal 47-92 mL/sq-m)  LV ESV: 47mL/sq-m (Normal 13-30 mL/sq-m)  LV SV: 34mL/sq-m (Normal 32-62 mL/sq-m)  CI: 2.0L/min/sq-m (Normal 1.7-4.2 L/min/sq-m)  Right ventricle: Normal size and systolic function  RV EF:  53% (Normal 47-74%)  Absolute volumes:  RV EDV: (Normal 88-227 mL)  RV ESV: 69mL (Normal 23-103 mL)  RV SV: 78mL (Normal 52-138 mL)  CO: 4.3L/min (Normal 2.8-8.8 L/min)  Indexed volumes:  RV EDV: 107mL/sq-m (Normal 55-105 mL/sq-m)  RV ESV: 60mL/sq-m (Normal 15-43 mL/sq-m)  RV SV: 36mL/sq-m (Normal 32-64 mL/sq-m)  CI: 2.0L/min/sq-m (Normal 1.7-4.2 L/min/sq-m)  Left atrium: Mild enlargement  Right atrium: Mild enlargement  Mitral valve: No regurgitation  Aortic valve: No regurgitation  Tricuspid valve: No regurgitation  Pericardium: Normal  IMPRESSION: 1. Asymmetric hypertrophy measuring up to 14mm in basal septum and lateral walls (7mm inferior wall), not meeting criteria for hypertrophic cardiomyopathy (<15 mm)  2. RV insertion site LGE, which can be seen in setting of elevated pulmonary pressures  3.  Normal LV size and systolic function (EF 56%)  4.  Normal RV size and systolic function (EF 53%)   Electronically Signed By: Lonni Nanas MD On: 06/02/2019 21:31   ______________________________________________________________________________________________       Risk Assessment/Calculations           Physical Exam VS:  BP 132/74   Pulse 62   Ht 5' 11 (1.803 m)   Wt 197 lb 12.8 oz (89.7 kg)   SpO2 97%   BMI 27.59 kg/m        Wt Readings from Last 3 Encounters:  02/22/24 197 lb 12.8 oz (89.7 kg)  10/15/23 208 lb 6.4 oz (94.5 kg)  03/03/23 201 lb 12.8 oz (91.5 kg)    GEN: Well nourished, well developed in no acute distress. Sitting comfortably on the exam table  NECK: No JVD  CARDIAC:  RRR, no murmurs, rubs, gallops. Radial pulses 2+ bilaterally  RESPIRATORY:  Clear to auscultation without rales, wheezing or rhonchi. Normal WOB on room air   ABDOMEN: Soft, non-tender, non-distended EXTREMITIES:  No edema in BLE; No deformity   ASSESSMENT AND PLAN  Palpitations  - Patient presents today for evaluation of palpitations.  Reports feeling with aware of his heartbeat at times, feels like a fluttering feeling in his chest.  Denies any chest pain, shortness of breath, dizziness, syncope, near syncope with this - Patient quit smoking cigarettes about 2 months ago.  Reports that palpitations were present even before he quit smoking - EKG today showed normal sinus rhythm - Ordered 14-day ZIO - Ordered BMP, mag, TSH, free T4 - Continue verapamil  180 mg at bedtime  Hypertensive heart diease  Hypertension  - Most recent cMRI from 2020 showed asymmetric hypertrophy measuring up to 14 mm in basal septum and lateral walls (7 mm), not meeting criteria for hypertrophic cardiomyopathy. Also showed normal LV size and systolic function, normal RV size and systolic function.  - Initial BP 140/82.  Repeat 132/74. No adjustments to BP medications at this time  - Patient has some dizziness when he bends over and stands up too quickly.  Encouraged him to increase his oral hydration and make position changes slowly. -  Euvolemic on exam. No SOB, ankle edema  - Continue olmesartan 40 mg daily  - Continue verapamil  180 mg daily  - Ordered BMP as above   Tobacco Use  - Patient quit smoking cigarettes about 2 months ago.  Has been chewing nicotine gum - Congratulated patient on this progress  HLD  - Labs followed by PCP  - Continue fluvastatin  80 mg daily   Chest Pain  - Coronary CTA in 04/2022 showed a coronary calcium score of 10.5 (47th percentile), mild CAD  - Patient denies any chest pain.  Stays active by playing tennis.  No chest pain or dyspnea on exertion - EKG today unchanged compared to EKG from 09/2022  - Continue ASA 81 mg daily  - Continue fluvastatin  80 mg daily    Dispo: Follow-up in 2 months  Signed, Rollo FABIENE Louder, [name]

## 2024-02-22 ENCOUNTER — Ambulatory Visit

## 2024-02-22 ENCOUNTER — Ambulatory Visit: Attending: Cardiology | Admitting: Cardiology

## 2024-02-22 ENCOUNTER — Encounter: Payer: Self-pay | Admitting: Cardiology

## 2024-02-22 VITALS — BP 132/74 | HR 62 | Ht 71.0 in | Wt 197.8 lb

## 2024-02-22 DIAGNOSIS — Z720 Tobacco use: Secondary | ICD-10-CM | POA: Diagnosis present

## 2024-02-22 DIAGNOSIS — E785 Hyperlipidemia, unspecified: Secondary | ICD-10-CM | POA: Diagnosis present

## 2024-02-22 DIAGNOSIS — I2510 Atherosclerotic heart disease of native coronary artery without angina pectoris: Secondary | ICD-10-CM | POA: Insufficient documentation

## 2024-02-22 DIAGNOSIS — R002 Palpitations: Secondary | ICD-10-CM | POA: Diagnosis not present

## 2024-02-22 DIAGNOSIS — I517 Cardiomegaly: Secondary | ICD-10-CM | POA: Diagnosis present

## 2024-02-22 DIAGNOSIS — I119 Hypertensive heart disease without heart failure: Secondary | ICD-10-CM | POA: Diagnosis not present

## 2024-02-22 NOTE — Patient Instructions (Signed)
 Medication Instructions:  No changes *If you need a refill on your cardiac medications before your next appointment, please call your pharmacy*  Lab Work: Today we are going to draw a Bmet, Mag, TSH, and Free T4 If you have labs (blood work) drawn today and your tests are completely normal, you will receive your results only by: MyChart Message (if you have MyChart) OR A paper copy in the mail If you have any lab test that is abnormal or we need to change your treatment, we will call you to review the results.  Testing/Procedures: Look at next page  Follow-Up: At [facility], you and your health needs are our priority.  As part of our continuing mission to provide you with exceptional heart care, our providers are all part of one team.  This team includes your primary Cardiologist (physician) and Advanced Practice Providers or APPs (Physician Assistants and Nurse Practitioners) who all work together to provide you with the care you need, when you need it.  Your next appointment:   2 month(s)  Provider:   Rollo Louder, [name]  We recommend signing up for the patient portal called MyChart.  Sign up information is provided on this After Visit Summary.  MyChart is used to connect with patients for Virtual Visits (Telemedicine).  Patients are able to view lab/test results, encounter notes, upcoming appointments, etc.  Non-urgent messages can be sent to your provider as well.   To learn more about what you can do with MyChart, go to [email or web address].   Other Instructions ZIO XT- Long Term Monitor Instructions  Your physician has requested you wear a ZIO patch monitor for 14 days.  This is a single patch monitor. [employer] supplies one patch monitor per enrollment. Additional stickers are not available. Please do not apply patch if you will be having a Nuclear Stress Test,  Echocardiogram, Cardiac CT, MRI, or Chest Xray during the period you would be wearing the  monitor.  The patch cannot be worn during these tests. You cannot remove and re-apply the  ZIO XT patch monitor.  Your ZIO patch monitor will be mailed 3 day USPS to your address on file. It may take 3-5 days  to receive your monitor after you have been enrolled.  Once you have received your monitor, please review the enclosed instructions. Your monitor  has already been registered assigning a specific monitor serial # to you.  Billing and Patient Assistance Program Information  We have supplied [employer] with any of your insurance information on file for billing purposes. [employer] offers a sliding scale Patient Assistance Program for patients that do not have  insurance, or whose insurance does not completely cover the cost of the ZIO monitor.  You must apply for the Patient Assistance Program to qualify for this discounted rate.  To apply, please call [employer] at [phone], select option 4, select option 2, ask to apply for  Patient Assistance Program. Meredeth will ask your household income, and how many people  are in your household. They will quote your out-of-pocket cost based on that information.  [employer] will also be able to set up a 75-month, interest-free payment plan if needed.  Applying the monitor   Shave hair from upper left chest.  Hold abrader disc by orange tab. Rub abrader in 40 strokes over the upper left chest as  indicated in your monitor instructions.  Clean area with 4 enclosed alcohol pads. Let dry.  Apply patch as indicated in monitor instructions. Patch will  be placed under collarbone on left  side of chest with arrow pointing upward.  Rub patch adhesive wings for 2 minutes. Remove white label marked 1. Remove the white  label marked 2. Rub patch adhesive wings for 2 additional minutes.  While looking in a mirror, press and release button in center of patch. A small green light will  flash 3-4 times. This will be your only indicator that the monitor has been turned on.   Do not shower for the first 24 hours. You may shower after the first 24 hours.  Press the button if you feel a symptom. You will hear a small click. Record Date, Time and  Symptom in the Patient Logbook.  When you are ready to remove the patch, follow instructions on the last 2 pages of Patient  Logbook. Stick patch monitor onto the last page of Patient Logbook.  Place Patient Logbook in the blue and white box. Use locking tab on box and tape box closed  securely. The blue and white box has prepaid postage on it. Please place it in the mailbox as  soon as possible. Your physician should have your test results approximately 7 days after the  monitor has been mailed back to [facility].  Call [facility] Customer Care at [phone] if you have questions regarding  your ZIO XT patch monitor. Call them immediately if you see an orange light blinking on your  monitor.  If your monitor falls off in less than 4 days, contact our Monitor department at [phone].  If your monitor becomes loose or falls off after 4 days call [employer] at [phone] for  suggestions on securing your monitor

## 2024-02-22 NOTE — Progress Notes (Unsigned)
Enrolled patient for a 14 day Zio XT monitor to be mailed to patients home  Luke Wheeler to read

## 2024-02-23 ENCOUNTER — Ambulatory Visit: Payer: Self-pay | Admitting: Cardiology

## 2024-02-23 DIAGNOSIS — I2510 Atherosclerotic heart disease of native coronary artery without angina pectoris: Secondary | ICD-10-CM

## 2024-02-23 DIAGNOSIS — R002 Palpitations: Secondary | ICD-10-CM

## 2024-02-23 LAB — BASIC METABOLIC PANEL WITH GFR
BUN/Creatinine Ratio: 13 (ref 10–24)
BUN: 18 mg/dL (ref 8–27)
CO2: 22 mmol/L (ref 20–29)
Calcium: 10.9 mg/dL — ABNORMAL HIGH (ref 8.6–10.2)
Chloride: 101 mmol/L (ref 96–106)
Creatinine, Ser: 1.43 mg/dL — ABNORMAL HIGH (ref 0.76–1.27)
Glucose: 66 mg/dL — ABNORMAL LOW (ref 70–99)
Potassium: 5 mmol/L (ref 3.5–5.2)
Sodium: 139 mmol/L (ref 134–144)
eGFR: 53 mL/min/{1.73_m2} — ABNORMAL LOW (ref >59)

## 2024-02-23 LAB — MAGNESIUM: Magnesium: 2 mg/dL (ref 1.6–2.3)

## 2024-02-23 LAB — TSH: TSH: 0.484 u[IU]/mL (ref 0.450–4.500)

## 2024-02-23 LAB — T4, FREE: Free T4: 1.13 ng/dL (ref 0.82–1.77)

## 2024-02-24 NOTE — Telephone Encounter (Signed)
-----   Message from Rollo FABIENE Louder sent at 02/23/2024  4:03 PM EDT ----- Please tell patient that his lab work from yesterday showed normal potassium and magnesium.  Thyroid  function normal.  His creatinine is a bit elevated at 1.43.  This suggests he may be dehydrated.   He should make sure that he is drinking plenty of fluids.  Repeat BMP in 2 weeks ----- Message ----- From: Interface, [facility] Lab Results In Sent: 02/23/2024   2:36 AM EDT To: Rollo JONELLE Louder, [name]

## 2024-02-24 NOTE — Telephone Encounter (Signed)
 Called patient advised of below they verbalized understanding.

## 2024-03-16 DIAGNOSIS — R002 Palpitations: Secondary | ICD-10-CM

## 2024-03-17 ENCOUNTER — Telehealth: Payer: Self-pay

## 2024-03-17 NOTE — Telephone Encounter (Signed)
 Called patient advised of below they verbalized understanding.

## 2024-03-17 NOTE — Telephone Encounter (Signed)
 Please tell patient that his recent cardiac monitor showed a single 7 beat run of NSVT (fast heart beats from the bottom of the heart). There were 8 episodes of SVT (fast heart beats from the top of the heart). Rare PACs and PVCs (extra beats, not dangerous). Recommend continuing verapamil  180 mg daily. HE should avoid excessive caffeine use and make sure he is staying hydrated to help minimize palpitations

## 2024-03-17 NOTE — Telephone Encounter (Deleted)
 Left message to call back

## 2024-04-21 NOTE — Progress Notes (Signed)
 Cardiology Office Note   Date:  05/05/2024  ID:  Luke Wheeler, DOB August 23, 1956, MRN [id number] PCP: Luke Hamilton, [name]  [facility] Providers Cardiologist:  [name], MD   History of Present Illness Luke Wheeler is a 68 y.o. 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 Patient followed by Dr. Emmer, presents today for evaluation of palpitations.   Patient previously underwent nuclear stress test in 11/2014 that was a normal study. Cardiac MRI in 05/2015 showed no morphologic evidence of apical or mid hypertrophic cardiomyopathy, EF 63%, normal valves, normal RV, no delayed hyperenhancement post gadolinium. Cardiac MRI in 05/2019 showed asymmetric hypertrophy measuring up to 14 mm in basal septum and lateral walls (7 mm), not meeting criteria for hypertrophic cardiomyopathy. Also showed normal LV size and systolic function, normal RV size and systolic function.    Coronary CTA in 04/2022 showed mild CAD, coronary calcium score of 10.5 (47th percentile), normal coronary origins with right dominance.    Patient last seen by Dr. Emmer on 10/15/23. At that time, patient was doing well.   I saw patient in clinic on 02/22/24 for evaluation of palpitations. He wore a cardiac monitor that showed predominantly NSR, 8 brief runs of SVT< 1 run RV lasting 7 beats. He was encouraged to continue verapamil , avoid triggers fro palpitations including caffeine, alcohol, dehydration   On interview today, patient reports he has overall been doing well from a heart perspective.  He continues to play tennis regularly.  Denies chest pain or shortness of breath on exertion.  He does not check his blood pressure at home.  His blood pressure is elevated in clinic today to 148/88.  Reviewed blood pressure at recent appointments and it has been elevated as well.  He notes that in the summer when he exerted himself, if he did not drink enough  water he would have episodes of near syncope.  He denies ever having true syncope.  He has been having issues with a pounding feeling on the side of his head.  This seems to come on spontaneously, lasts a few minutes, then resolves.  He denies having a headache or any pain with this sensation. No palpitations in his chest    Studies Reviewed Cardiac Studies & Procedures   ______________________________________________________________________________________________        SHERRILEE  LONG TERM MONITOR (3-14 DAYS) 03/16/2024  Narrative Patch Wear Time:  13 days and 18 hours (2025-07-03T19:27:13-0400 to 2025-07-17T13:56:20-0400)  Patient had a min HR of 48 bpm, max HR of 162 bpm, and avg HR of 76 bpm. Predominant underlying rhythm was Sinus Rhythm. First Degree AV Block was present. Slight P wave morphology changes were noted. 1 run of Ventricular Tachycardia occurred lasting 7 beats with a max rate of 150 bpm (avg 124 bpm). 8 Supraventricular Tachycardia runs occurred, the run with the fastest interval lasting 14 beats with a max rate of 136 bpm, the longest lasting 17 beats with an avg rate of 104 bpm. Some episodes of Supraventricular Tachycardia may be possible Atrial Tachycardia with variable block. Isolated SVEs were rare (<1.0%), SVE Couplets were rare (<1.0%), and SVE Triplets were rare (<1.0%). Isolated VEs were rare (<1.0%), VE Couplets were rare (<1.0%), and no VE Triplets were present. Ventricular Bigeminy and Trigeminy were present.  [name] MD [facility]   CT SCANS  CT CORONARY MORPH W/CTA COR W/SCORE 05/08/2022  Addendum 05/11/2022  7:56 AM ADDENDUM REPORT: 05/11/2022 07:54  EXAM: OVER-READ INTERPRETATION  CT CHEST  The following report is an over-read performed by radiologist Dr. [name] on 05/11/2022. This over-read does not include interpretation of cardiac or coronary anatomy or pathology. The coronary CTA interpretation by the  cardiologist is attached.  COMPARISON:  CT scan 07/24/2021  FINDINGS: Normal caliber ascending thoracic aorta.  No dissection.  No mediastinal or hilar mass or lymphadenopathy. The esophagus is unremarkable.  No acute pulmonary findings or worrisome pulmonary lesions. Emphysematous changes are noted. No pleural effusion.  No significant upper abdominal findings.  No significant bony findings.  IMPRESSION: Emphysematous changes but no other significant extracardiac findings.  Emphysema (ICD10-J43.9).   Electronically Signed By: MYRTIS Stammer M.D. On: 05/11/2022 07:54  Narrative HISTORY: Chest pain, nonspecific Pericardial disease suspected  EXAM: Cardiac/Coronary  CT  TECHNIQUE: The patient was scanned on a [employer].  PROTOCOL: A 120 kV prospective scan was triggered in the descending thoracic aorta at 111 HU's. Axial non-contrast 3 mm slices were carried out through the heart. The data set was analyzed on a dedicated work station and scored using the Agatston method. Gantry rotation speed was 250 msecs and collimation was .6 mm. Beta blockade and 0.8 mg of sl NTG was given. The 3D data set was reconstructed in 5% intervals of the 35-75 % of the R-R cycle. Systolic and diastolic phases were analyzed on a dedicated work station using MPR, MIP and VRT modes. The patient received contrast: 100mL OMNIPAQUE  IOHEXOL  350 MG/ML SOLN.  FINDINGS: Image quality: Good  Noise artifact is: Mild slab artifact  Coronary calcium score is 10.5, which places the patient in the 47th percentile for age and sex matched control.  Coronary arteries: Normal coronary origins.  Right dominance.  Right Coronary Artery: Mild atherosclerotic plaque in the ostial and mid-distal RCA, 25-49% stenosis, as well as the proximal PLA.  Left Main Coronary Artery: No detectable plaque or stenosis.  Left Anterior Descending Coronary Artery: Mixed atherosclerotic plaque in the  mid LAD with positive remodeling and minimal stenosis, <25% stenosis.  Ramus intermedius: Mild atherosclerotic plaque in the ostial RI, 25-49% stenosis.  Left Circumflex Artery: Minimal atherosclerotic plaque in the proximal LCx, <25% stenosis.  Aorta: Normal size, 34 mm at the mid ascending aorta (level of the PA bifurcation) measured double oblique. Trivial aortic root calcifications. No dissection.  Aortic Valve: Trivial calcifications.  Other findings:  Normal pulmonary vein drainage into the left atrium.  Normal left atrial appendage without thrombus.  Normal size of the pulmonary artery.  Please see separate report from [facility] for non-cardiac findings.  IMPRESSION: 1. Mild CAD, 25-49% stenosis, CADRADS 2.  2. Coronary calcium score is 10.5, which places the patient in the 47th percentile for age and sex matched control.  3. Normal coronary origins with right dominance.  RECOMMENDATIONS: CAD-RADS 2. Mild non-obstructive CAD (25-49%). Consider non-atherosclerotic causes of chest pain. Consider preventive therapy and risk factor modification.  Electronically Signed: By: [name] M.D. On: 05/10/2022 21:04   CARDIAC MRI  MR CARDIAC MORPHOLOGY W WO CONTRAST 06/02/2019  Narrative CLINICAL DATA:  Eval for HCM. Prior CMR 05/2015 with no evidence of HCM  EXAM: CARDIAC MRI  TECHNIQUE: The patient was scanned on a 1.5 Tesla Siemens magnet. A dedicated cardiac coil was used. Functional imaging was done using [employer] sequences. 2,3, and 4 chamber views were done to assess for RWMA's. Modified Simpson's rule using a short axis stack was used to calculate an ejection fraction on a dedicated work  station using [employer]. The patient received 10 cc of Gadavist . After 10 minutes inversion recovery sequences were used to assess for infiltration and scar tissue.  CONTRAST:  10 cc  of Gadavist   FINDINGS: Left ventricle:  - Normal  size  - Normal systolic function  - Asymmetric hypertrophy measuring up to 14mm in basal septum and lateral walls (7mm inferior wall)  - RV insertion site LGE  LV EF: 56% (Normal 56-78%)  Absolute volumes:  LV EDV: (Normal 77-195 mL)  LV ESV: 62mL (Normal 19-72 mL)  LV SV: 77mL (Normal 51-133 mL)  CO: 4.3L/min (Normal 2.8-8.8 L/min)  Indexed volumes:  LV EDV: 30mL/sq-m (Normal 47-92 mL/sq-m)  LV ESV: 38mL/sq-m (Normal 13-30 mL/sq-m)  LV SV: 76mL/sq-m (Normal 32-62 mL/sq-m)  CI: 2.0L/min/sq-m (Normal 1.7-4.2 L/min/sq-m)  Right ventricle: Normal size and systolic function  RV EF:  53% (Normal 47-74%)  Absolute volumes:  RV EDV: (Normal 88-227 mL)  RV ESV: 69mL (Normal 23-103 mL)  RV SV: 78mL (Normal 52-138 mL)  CO: 4.3L/min (Normal 2.8-8.8 L/min)  Indexed volumes:  RV EDV: 1mL/sq-m (Normal 55-105 mL/sq-m)  RV ESV: 22mL/sq-m (Normal 15-43 mL/sq-m)  RV SV: 54mL/sq-m (Normal 32-64 mL/sq-m)  CI: 2.0L/min/sq-m (Normal 1.7-4.2 L/min/sq-m)  Left atrium: Mild enlargement  Right atrium: Mild enlargement  Mitral valve: No regurgitation  Aortic valve: No regurgitation  Tricuspid valve: No regurgitation  Pericardium: Normal  IMPRESSION: 1. Asymmetric hypertrophy measuring up to 14mm in basal septum and lateral walls (7mm inferior wall), not meeting criteria for hypertrophic cardiomyopathy (<15 mm)  2. RV insertion site LGE, which can be seen in setting of elevated pulmonary pressures  3.  Normal LV size and systolic function (EF 56%)  4.  Normal RV size and systolic function (EF 53%)   Electronically Signed By: Lonni Nanas MD On: 06/02/2019 21:31   ______________________________________________________________________________________________      Risk Assessment/Calculations        Physical Exam VS:  BP (!) 148/88 (BP Location: Left Arm, Patient Position: Sitting)   Pulse 65   Ht 5' 11 (1.803 m)   Wt 198 lb 6.4  oz (90 kg)   SpO2 99%   BMI 27.67 kg/m        Wt Readings from Last 3 Encounters:  05/05/24 198 lb 6.4 oz (90 kg)  02/22/24 197 lb 12.8 oz (89.7 kg)  10/15/23 208 lb 6.4 oz (94.5 kg)    GEN: Well nourished, well developed in no acute distress. Sitting comfortably on the exam table  NECK: No JVD  CARDIAC:  RRR, no murmurs, rubs, gallops. Radial pulses 2+ bilaterally  RESPIRATORY:  Clear to auscultation without rales, wheezing or rhonchi. Normal WOB on room air   ABDOMEN: Soft, non-tender, non-distended EXTREMITIES:  No edema in BLE ; No deformity   ASSESSMENT AND PLAN  Palpitations  - Patient was seen on 7/1 for evaluation of palpitations. Cardiac monitor showed predominantly NSR, 8 brief runs of SVT with longest run  lasting 7 beats - Patient reports that palpitations have overall improved since last being seen - on 7/1, BMP with K 5.0, creatinine 1.43. Suspected dehydration and was encouraged to drink more water. Mag 2.0. TSH and free T4 normal  - Ordered repeat BMP today  - Start carvedilol  3.125 mg BID  - Continue verapamil  180 mg at bedtime   Hypertensive heart diease  Hypertension  Near syncope  - Most recent cMRI from 2020 showed asymmetric hypertrophy measuring up to 14 mm in basal septum  and lateral walls (7 mm), not meeting criteria for hypertrophic cardiomyopathy. Also showed normal LV size and systolic function, normal RV size and systolic function.  - Patient reports that he had a few episodes of near syncope over the summer when he was dehydrated. No syncope. Symptoms improved with hydration  - No murmur on exam  - Ordered echocardiogram to monitor LVH with episodes of near syncope   - BP elevated today in clinic. With recent palpitations, start carvedilol  3.125 mg BID  - Continue verapamil  180 mg daily, olmesartan 40 mg daily  - Ordered BMP   HLD  - Labs followed by PCP, LDL 101 in 11/2022   - Continue fluvastatin  80 mg daily    Coronary calcifications  -  Coronary CTA in 04/2022 showed a coronary calcium score of 10.5 (47th percentile), mild CAD  - Patient denies any chest pain.  Stays active by playing tennis.  No chest pain or dyspnea on exertion - Continue ASA 81 mg daily  - Continue fluvastatin  80 mg daily    Dispo: 5 months with Dr. Delford   Signed, Rollo FABIENE Louder, [name]

## 2024-05-05 ENCOUNTER — Encounter: Payer: Self-pay | Admitting: Cardiology

## 2024-05-05 ENCOUNTER — Ambulatory Visit: Attending: Cardiology | Admitting: Cardiology

## 2024-05-05 VITALS — BP 148/88 | HR 65 | Ht 71.0 in | Wt 198.4 lb

## 2024-05-05 DIAGNOSIS — E785 Hyperlipidemia, unspecified: Secondary | ICD-10-CM | POA: Diagnosis present

## 2024-05-05 DIAGNOSIS — R002 Palpitations: Secondary | ICD-10-CM | POA: Diagnosis present

## 2024-05-05 DIAGNOSIS — I517 Cardiomegaly: Secondary | ICD-10-CM | POA: Insufficient documentation

## 2024-05-05 DIAGNOSIS — I2510 Atherosclerotic heart disease of native coronary artery without angina pectoris: Secondary | ICD-10-CM | POA: Insufficient documentation

## 2024-05-05 DIAGNOSIS — I119 Hypertensive heart disease without heart failure: Secondary | ICD-10-CM | POA: Diagnosis present

## 2024-05-05 MED ORDER — CARVEDILOL 3.125 MG PO TABS: 3.1250 mg | ORAL_TABLET | Freq: Two times a day (BID) | ORAL | 3 refills | Status: AC

## 2024-05-05 NOTE — Patient Instructions (Signed)
 Medication Instructions:  Start Carvedilol  3.125 mg take one tablet twice daily *If you need a refill on your cardiac medications before your next appointment, please call your pharmacy*  Lab Work: Today- BMET If you have labs (blood work) drawn today and your tests are completely normal, you will receive your results only by: MyChart Message (if you have MyChart) OR A paper copy in the mail If you have any lab test that is abnormal or we need to change your treatment, we will call you to review the results.  Testing/Procedures: Your physician has requested that you have an echocardiogram. Echocardiography is a painless test that uses sound waves to create images of your heart. It provides your doctor with information about the size and shape of your heart and how well your heart's chambers and valves are working. This procedure takes approximately one hour. There are no restrictions for this procedure. Please do NOT wear cologne, perfume, aftershave, or lotions (deodorant is allowed). Please arrive 15 minutes prior to your appointment time.  Please note: We ask at that you not bring children with you during ultrasound (echo/ vascular) testing. Due to room size and safety concerns, children are not allowed in the ultrasound rooms during exams. Our front office staff cannot provide observation of children in our lobby area while testing is being conducted. An adult accompanying a patient to their appointment will only be allowed in the ultrasound room at the discretion of the ultrasound technician under special circumstances. We apologize for any inconvenience.   Follow-Up: At [facility], you and your health needs are our priority.  As part of our continuing mission to provide you with exceptional heart care, our providers are all part of one team.  This team includes your primary Cardiologist (physician) and Advanced Practice Providers or APPs (Physician Assistants and Nurse  Practitioners) who all work together to provide you with the care you need, when you need it.  Your next appointment:   4 month(s)  Provider:   [name], MD

## 2024-05-09 ENCOUNTER — Ambulatory Visit: Payer: Self-pay | Admitting: Cardiology

## 2024-05-09 LAB — BASIC METABOLIC PANEL WITH GFR
BUN/Creatinine Ratio: 10 (ref 10–24)
BUN: 14 mg/dL (ref 8–27)
CO2: 19 mmol/L — ABNORMAL LOW (ref 20–29)
Calcium: 10.2 mg/dL (ref 8.6–10.2)
Chloride: 103 mmol/L (ref 96–106)
Creatinine, Ser: 1.35 mg/dL — ABNORMAL HIGH (ref 0.76–1.27)
Glucose: 123 mg/dL — ABNORMAL HIGH (ref 70–99)
Potassium: 4.6 mmol/L (ref 3.5–5.2)
Sodium: 140 mmol/L (ref 134–144)
eGFR: 57 mL/min/1.73 — ABNORMAL LOW (ref >59)

## 2024-06-14 ENCOUNTER — Other Ambulatory Visit: Payer: Self-pay | Admitting: Cardiovascular Disease

## 2024-06-16 ENCOUNTER — Ambulatory Visit (HOSPITAL_COMMUNITY)
Admission: RE | Admit: 2024-06-16 | Discharge: 2024-06-16 | Disposition: A | Discharge status: Home / Self Care | Source: Ambulatory Visit | Attending: Cardiology | Admitting: Cardiology

## 2024-06-16 ENCOUNTER — Telehealth: Payer: Self-pay | Admitting: Cardiovascular Disease

## 2024-06-16 ENCOUNTER — Other Ambulatory Visit: Payer: Self-pay | Admitting: Cardiology

## 2024-06-16 DIAGNOSIS — I119 Hypertensive heart disease without heart failure: Secondary | ICD-10-CM | POA: Insufficient documentation

## 2024-06-16 DIAGNOSIS — E785 Hyperlipidemia, unspecified: Secondary | ICD-10-CM

## 2024-06-16 DIAGNOSIS — I2510 Atherosclerotic heart disease of native coronary artery without angina pectoris: Secondary | ICD-10-CM

## 2024-06-16 DIAGNOSIS — R002 Palpitations: Secondary | ICD-10-CM

## 2024-06-16 DIAGNOSIS — I517 Cardiomegaly: Secondary | ICD-10-CM

## 2024-06-16 LAB — ECHOCARDIOGRAM SQUAT TO STAND
Area-P 1/2: 4.06 cm2
S' Lateral: 2.5 cm

## 2024-06-16 NOTE — Telephone Encounter (Signed)
 Called patient to schedule an appointment with Dr. Delford. Patient will see him in January. Patient's Verapamil  has already been sent in.

## 2024-06-16 NOTE — Telephone Encounter (Signed)
 Good morning,   Pt stated he needs a refill on his verapamil . He has ran out. He is active on mychart but he would perfer a call if needed.

## 2024-06-20 ENCOUNTER — Ambulatory Visit: Payer: Self-pay | Admitting: Cardiology

## 2024-06-20 DIAGNOSIS — I421 Obstructive hypertrophic cardiomyopathy: Secondary | ICD-10-CM

## 2024-07-07 ENCOUNTER — Encounter (HOSPITAL_COMMUNITY): Payer: Self-pay

## 2024-07-11 ENCOUNTER — Other Ambulatory Visit: Payer: Self-pay | Admitting: Cardiology

## 2024-07-11 ENCOUNTER — Ambulatory Visit (HOSPITAL_COMMUNITY)
Admission: RE | Admit: 2024-07-11 | Discharge: 2024-07-11 | Disposition: A | Discharge status: Home / Self Care | Source: Ambulatory Visit | Attending: Cardiology | Admitting: Cardiology

## 2024-07-11 DIAGNOSIS — I421 Obstructive hypertrophic cardiomyopathy: Secondary | ICD-10-CM

## 2024-07-11 MED ORDER — GADOBUTROL 1 MMOL/ML IV SOLN
10.0000 mL | Freq: Once | INTRAVENOUS | Status: AC | PRN
  Administered 2024-07-11: 10 mL via INTRAVENOUS

## 2024-07-13 ENCOUNTER — Ambulatory Visit: Payer: Self-pay | Admitting: Cardiology

## 2024-07-25 ENCOUNTER — Ambulatory Visit: Attending: Internal Medicine | Admitting: Internal Medicine

## 2024-07-25 ENCOUNTER — Ambulatory Visit: Admitting: Genetic Counselor

## 2024-07-25 VITALS — BP 179/90 | HR 65 | Ht 71.0 in | Wt 201.0 lb

## 2024-07-25 DIAGNOSIS — I421 Obstructive hypertrophic cardiomyopathy: Secondary | ICD-10-CM | POA: Diagnosis present

## 2024-07-25 DIAGNOSIS — Z79899 Other long term (current) drug therapy: Secondary | ICD-10-CM | POA: Diagnosis present

## 2024-07-25 DIAGNOSIS — I517 Cardiomegaly: Secondary | ICD-10-CM | POA: Insufficient documentation

## 2024-07-25 DIAGNOSIS — I10 Essential (primary) hypertension: Secondary | ICD-10-CM | POA: Diagnosis present

## 2024-07-25 MED ORDER — SACUBITRIL-VALSARTAN 49-51 MG PO TABS: 1.0000 | ORAL_TABLET | Freq: Two times a day (BID) | ORAL | 3 refills | Status: DC

## 2024-07-25 NOTE — Progress Notes (Signed)
 Cardiology Office Note:  .    Date:  07/25/2024  ID:  Luke Wheeler, DOB [birth year], MRN [id number] PCP: Darra Glendia RIGGERS  [facility] Providers Cardiologist:  [name], MD     CC: LVH Consulted for the evaluation of LHV at the behest of Dr. Emmer  History of Present Illness: .    Luke Wheeler is a 68 y.o. male with long-standing hypertension and coronary artery calcification who presents with palpitations and high blood pressure. He was referred by Dr. Nishan for evaluation of possible hypertrophic cardiomyopathy and hypertensive heart disease.  He has a history of long-standing hypertension and coronary artery calcification. He experiences occasional intermittent palpitations and consistently high blood pressure, with recent readings of 179/90, despite adherence to olmesartan. He notes that his blood pressure was previously lower even while smoking, but has increased over time.  He experienced a near-syncope episode during the summer while cutting grass in hot weather, feeling like he was going to pass out and had to sit down for about five minutes to recover. He attributes this to the heat and has since avoided outdoor activities in high temperatures.  He is physically active, playing tennis regularly without experiencing chest pain or shortness of breath, although he feels tired after playing multiple sets. No actual passing out during these activities.  His family history includes a mother who lived to 22 and died of COVID-19, a father who lived to an advanced age, a brother who experienced a transient ischemic attack, and a sister who died from a pulmonary embolism following a deep vein thrombosis.  Socially, he is retired and spends his time doing household chores, using the computer, and playing tennis. He has a history of smoking since the tenth grade, with a brief cessation in 2020, and is considering quitting again. He has two children from his second  marriage, aged 39 and 9.  Discussed the use of AI scribe software for clinical note transcription with the patient, who gave verbal consent to proceed.  Relevant histories: .  Social  - Tobacco: Current smoker (plans to quit on the 07/25/24). Started smoking in tenth grade, stopped in 2020 for almost a year, and resumed. - Employment: Retired Administrator Status: Married - Living Situation: Lives in a house - Plays tennis regularly. Has children from his second marriage. Has a brother.  No known HCM and know SCD in the family ROS: As per HPI.   Studies Reviewed: .     Cardiac Studies & Procedures   ______________________________________________________________________________________________        SHERRILEE  LONG TERM MONITOR (3-14 DAYS) 03/16/2024  Narrative Patch Wear Time:  13 days and 18 hours (2025-07-03T19:27:13-0400 to 2025-07-17T13:56:20-0400)  Patient had a min HR of 48 bpm, max HR of 162 bpm, and avg HR of 76 bpm. Predominant underlying rhythm was Sinus Rhythm. First Degree AV Block was present. Slight P wave morphology changes were noted. 1 run of Ventricular Tachycardia occurred lasting 7 beats with a max rate of 150 bpm (avg 124 bpm). 8 Supraventricular Tachycardia runs occurred, the run with the fastest interval lasting 14 beats with a max rate of 136 bpm, the longest lasting 17 beats with an avg rate of 104 bpm. Some episodes of Supraventricular Tachycardia may be possible Atrial Tachycardia with variable block. Isolated SVEs were rare (<1.0%), SVE Couplets were rare (<1.0%), and SVE Triplets were rare (<1.0%). Isolated VEs were rare (<1.0%), VE Couplets were rare (<1.0%), and no VE Triplets were present. Ventricular Bigeminy  and Trigeminy were present.  [name] MD [facility]   CT SCANS  CT CORONARY MORPH W/CTA COR W/SCORE 05/08/2022  Addendum 05/11/2022  7:56 AM ADDENDUM REPORT: 05/11/2022 07:54  EXAM: OVER-READ INTERPRETATION  CT CHEST  The following report  is an over-read performed by radiologist Dr. [name] on 05/11/2022. This over-read does not include interpretation of cardiac or coronary anatomy or pathology. The coronary CTA interpretation by the cardiologist is attached.  COMPARISON:  CT scan 07/24/2021  FINDINGS: Normal caliber ascending thoracic aorta.  No dissection.  No mediastinal or hilar mass or lymphadenopathy. The esophagus is unremarkable.  No acute pulmonary findings or worrisome pulmonary lesions. Emphysematous changes are noted. No pleural effusion.  No significant upper abdominal findings.  No significant bony findings.  IMPRESSION: Emphysematous changes but no other significant extracardiac findings.  Emphysema (ICD10-J43.9).   Electronically Signed By: MYRTIS Stammer M.D. On: 05/11/2022 07:54  Narrative HISTORY: Chest pain, nonspecific Pericardial disease suspected  EXAM: Cardiac/Coronary  CT  TECHNIQUE: The patient was scanned on a [employer].  PROTOCOL: A 120 kV prospective scan was triggered in the descending thoracic aorta at 111 HU's. Axial non-contrast 3 mm slices were carried out through the heart. The data set was analyzed on a dedicated work station and scored using the Agatston method. Gantry rotation speed was 250 msecs and collimation was .6 mm. Beta blockade and 0.8 mg of sl NTG was given. The 3D data set was reconstructed in 5% intervals of the 35-75 % of the R-R cycle. Systolic and diastolic phases were analyzed on a dedicated work station using MPR, MIP and VRT modes. The patient received contrast: 100mL OMNIPAQUE  IOHEXOL  350 MG/ML SOLN.  FINDINGS: Image quality: Good  Noise artifact is: Mild slab artifact  Coronary calcium score is 10.5, which places the patient in the 47th percentile for age and sex matched control.  Coronary arteries: Normal coronary origins.  Right dominance.  Right Coronary Artery: Mild atherosclerotic  plaque in the ostial and mid-distal RCA, 25-49% stenosis, as well as the proximal PLA.  Left Main Coronary Artery: No detectable plaque or stenosis.  Left Anterior Descending Coronary Artery: Mixed atherosclerotic plaque in the mid LAD with positive remodeling and minimal stenosis, <25% stenosis.  Ramus intermedius: Mild atherosclerotic plaque in the ostial RI, 25-49% stenosis.  Left Circumflex Artery: Minimal atherosclerotic plaque in the proximal LCx, <25% stenosis.  Aorta: Normal size, 34 mm at the mid ascending aorta (level of the PA bifurcation) measured double oblique. Trivial aortic root calcifications. No dissection.  Aortic Valve: Trivial calcifications.  Other findings:  Normal pulmonary vein drainage into the left atrium.  Normal left atrial appendage without thrombus.  Normal size of the pulmonary artery.  Please see separate report from [facility] for non-cardiac findings.  IMPRESSION: 1. Mild CAD, 25-49% stenosis, CADRADS 2.  2. Coronary calcium score is 10.5, which places the patient in the 47th percentile for age and sex matched control.  3. Normal coronary origins with right dominance.  RECOMMENDATIONS: CAD-RADS 2. Mild non-obstructive CAD (25-49%). Consider non-atherosclerotic causes of chest pain. Consider preventive therapy and risk factor modification.  Electronically Signed: By: [name] M.D. On: 05/10/2022 21:04   CARDIAC MRI  MR CARDIAC MORPHOLOGY W WO CONTRAST 07/11/2024  Narrative CLINICAL DATA:  30M with hypertension. CMR in 2020 shows asymmetric LVH not meeting criteria for HCM. Echo with severe LVH  EXAM: CARDIAC MRI  TECHNIQUE: The patient was scanned on a 1.5 Tesla Siemens magnet.  A dedicated cardiac coil was used. Functional imaging was done using Fiesta sequences. 2,3, and 4 chamber views were done to assess for RWMA's. Modified Simpson's rule using a short axis stack was used to calculate an ejection  fraction on a dedicated work [employer]. The patient received 10 cc of Gadavist . After 10 minutes inversion recovery sequences were used to assess for infiltration and scar tissue. Phase contrast velocity mapping was performed above the aortic and pulmonic valves  CONTRAST:  10 cc  of Gadavist   FINDINGS: Left ventricle:  -Moderate concentric LV hypertrophy, measuring up to 15mm in basal septum and posterior wall. While wall thickness of at least 15mm raises concern for HCM, suspect more likely hypertensive heart disease given concentric LVH  -Normal size  -Low normal systolic function  -Normal ECV (24%)  -Normal T2 values  -RV insertion site LGE  LV EF: 51% (Normal 49-79%)  Absolute volumes:  LV EDV: (Normal 95-215 mL)  LV ESV: 81mL (Normal 25-85 mL)  LV SV: 83mL (Normal 61-145 mL)  CO: 4.8L/min (Normal 3.4-7.8 L/min)  Indexed volumes:  LV EDV: 5mL/sq-m (Normal 50-108 mL/sq-m)  LV ESV: 27mL/sq-m (Normal 11-47 mL/sq-m)  LV SV: 76mL/sq-m (Normal 33-72 mL/sq-m)  CI: 2.3L/min/sq-m (Normal 1.8-4.2 L/min/sq-m)  Right ventricle: Normal size and systolic function  RV EF:  51% (Normal 51-80%)  Absolute volumes:  RV EDV: (Normal 109-217 mL)  RV ESV: 90mL (Normal 23-91 mL)  RV SV: 94mL (Normal 71-141 mL)  CO: 5.4L/min (Normal 2.8-8.8 L/min)  Indexed volumes:  RV EDV: 29mL/sq-m (Normal 58-109 mL/sq-m)  RV ESV: 12mL/sq-m (Normal 12-46 mL/sq-m)  RV SV: 1mL/sq-m (Normal 38-71 mL/sq-m)  CI: 2.6L/min/sq-m (Normal 1.7-4.2 L/min/sq-m)  Left atrium: Mild enlargement  Right atrium: Mild enlargement  Mitral valve: Mild regurgitation  Aortic valve: Trivial regurgitation  Tricuspid valve: Mild to moderate regurgitation  Pulmonic valve: Trivial regurgitation  Aorta: Normal proximal ascending aorta  Pericardium: Normal  IMPRESSION: 1. Moderate concentric LV hypertrophy, measuring up to 15mm in basal septum and posterior  wall. While wall thickness of at least 15mm raises concern for HCM, suspect more likely hypertensive heart disease given concentric LVH  2.  Normal LV size with low normal systolic function (EF 51%)  3.  Normal RV size and systolic function (EF 51%)  4. RV insertion site LGE, which is a nonspecific scar pattern often seen in setting of elevated pulmonary pressures   Electronically Signed By: Lonni Nanas M.D. On: 07/11/2024 17:32   ______________________________________________________________________________________________       Physical Exam:    VS:  BP (!) 179/90   Pulse 65   Ht 5' 11 (1.803 m)   Wt 201 lb (91.2 kg)   SpO2 97%   BMI 28.03 kg/m    Wt Readings from Last 3 Encounters:  07/25/24 201 lb (91.2 kg)  05/05/24 198 lb 6.4 oz (90 kg)  02/22/24 197 lb 12.8 oz (89.7 kg)    Gen: no distress   Neck: No JVD Cardiac: No Rubs or Gallops, systolic murmur with standing and Valsalva, RRR +2 radial pulses Respiratory: Clear to auscultation bilaterally, normal effort, normal  respiratory rate GI: Soft, nontender, non-distended  MS: No  edema;  moves all extremities Integument: Skin feels warm Neuro:  At time of evaluation, alert and oriented to person/place/time/situation  Psych: Normal affect, patient feels ok  ASSESSMENT AND PLAN: .    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 - Smoking cessation started  Tobacco use disorder Long-standing tobacco use since 10th grade. Recently resumed smoking after  a brief cessation in 2020. Smoking cessation is critical for improving blood pressure control and overall cardiovascular health. - Set a quit date for smoking cessation on December 7th, 2024. - Provided support and resources for smoking cessation.  Family history Reviewed, Discussed family screening  - Discussed my suspicion for only possible HCM  SCD  Assessment - SCD risk estimated to be 1.12 at 5 years SDM: we have discussed with the possibilities of hypertensive disease   Atrial fibrillation Assessment  - HCM-AF score 18 - Atrial arrhythmia management: no AF on last eval; unclear if he truly has HCM  Medication symptom plan - Long-standing hypertension with coronary artery calcification and hyperlipidemia. Left ventricular hypertrophy with a thickness of 17 mm on physical exam and 15 mm on MRI. Differential includes hypertrophic cardiomyopathy versus hypertensive heart disease. No high-risk features for hypertrophic cardiomyopathy. Blood pressure remains elevated despite current medication regimen. Risk of sudden cardiac death and atrial fibrillation is likely low. Smoking cessation is crucial for blood pressure control. - Switched olmesartan to Entresto (sacubitril/valsartan) to improve blood pressure control. - Ordered blood work to monitor kidney function after medication change. - Encouraged smoking cessation with a quit date set for December 7th, 2024. - Monitor blood pressure after medication change and smoking cessation. - Will consider genetic testing for hypertrophic cardiomyopathy if indicated.  If Sarcomeric HCM or high risk features will see back, otherwise can return to clinical cardiology team   Time:   I have spent a total of 60 minutes with the patient reviewing notes, imaging, EKGs, labs, and examining the patient as well as establishing an assessment and plan that was discussed personally with the patient. Discussed disease state education, using shared decision  making tools and cardiac modeling, and hypertensive phenocopy. Reviewed care and plan in collaboration with clinical genetics   Stanly Leavens, MD FASE [facility] Cardiologist [facility]  [address] [place], [address] [place], [place] [address] [phone]  2:18 PM

## 2024-07-25 NOTE — Patient Instructions (Signed)
 Medication Instructions:  Please STOP taking benicar (olmesartan).  Please START taking entresto 49/51 twice a day.  *If you need a refill on your cardiac medications before your next appointment, please call your pharmacy*  Lab Work: Please complete a BMET in 1-2 weeks. You can walk in to any [facility] to complete these labs and you do not need to be fasting.  If you have labs (blood work) drawn today and your tests are completely normal, you will receive your results only by: MyChart Message (if you have MyChart) OR A paper copy in the mail If you have any lab test that is abnormal or we need to change your treatment, we will call you to review the results.  Testing/Procedures: None.  Follow-Up: At [facility], you and your health needs are our priority.  As part of our continuing mission to provide you with exceptional heart care, our providers are all part of one team.  This team includes your primary Cardiologist (physician) and Advanced Practice Providers or APPs (Physician Assistants and Nurse Practitioners) who all work together to provide you with the care you need, when you need it.  Your next appointment will be as needed:     Provider:   Dr. EMERSON Leavens,  MD

## 2024-08-03 ENCOUNTER — Other Ambulatory Visit: Payer: Self-pay | Admitting: Internal Medicine

## 2024-08-03 LAB — BASIC METABOLIC PANEL WITH GFR
BUN/Creatinine Ratio: 11 (ref 10–24)
BUN: 14 mg/dL (ref 8–27)
CO2: 23 mmol/L (ref 20–29)
Calcium: 10.1 mg/dL (ref 8.6–10.2)
Chloride: 101 mmol/L (ref 96–106)
Creatinine, Ser: 1.31 mg/dL — ABNORMAL HIGH (ref 0.76–1.27)
Glucose: 131 mg/dL — ABNORMAL HIGH (ref 70–99)
Potassium: 4.5 mmol/L (ref 3.5–5.2)
Sodium: 140 mmol/L (ref 134–144)
eGFR: 59 mL/min/1.73 — ABNORMAL LOW (ref >59)

## 2024-08-04 NOTE — Progress Notes (Signed)
 Pre Test Genetic Consult  Referral Reason  Luke Wheeler, a new HCM patient, is referred for genetic consult and testing of hypertrophic cardiomyopathy.   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  Recent cardiac MRI (07/11/24) demonstrated moderate concentric hypertrophy suggestive of hypertensive heart disease with basal and posterior wall thickness of 1.5 cm, LVEF 51% and no scarring.  Luke Wheeler reports having some heart palpitations, near syncope in 2024 while mowing grass and reports a syncope event at age 30 when he collapsed in front of his wife's hotel room door after running up a flight of stairs. He tells me that he quickly recovered and started doing pushups when his wife opened the door! He did not seek medical help for this event.   Traditional Risk Factors Luke Wheeler reports having HTN at 13 but did not take medications for several years until age 42.  Family history  Relation to Proband Pedigree # Current age Heart condition/age of onset Notes  Sons, 3 IV.2-IV.4 40, 18, 14 None Echo/EKG to be done  Daughter IV.1 31 None Echo/EKG to be done  Grandchildren, 3 V.1-V.3 19-9 None         Sisters, 4 III.2-III.5 Deceased 61, 59 None III.2- Died @ 30- pancreatic cancer III.5- Died @ 30s- thrombosis in heart by autopsy III.4- pedal edema  Brother III.6 16 None Normal cardiac MRI after stroke evaluation  Nephew, nieces IV,5- IV.8 28-10 None         Father II.1 Deceased None Died @ 68- old age  Paternal relatives ? Deceased ? Died of ?        Mother II.2 Deceased None Died @ 15- COVID19  Maternal aunt II.3 40 None   Maternal uncles, 4 II.4-II.7 Deceased None 3 died of old age @ 68, 59, 33 1 died of poor lifestyle @ 12  Maternal grandfather I.3 Deceased None Died @ 34- old age  Maternal  grandmother I.4 Deceased None Died in sleep @ 87   Genetics Luke Wheeler was counseled on the genetics of hypertrophic cardiomyopathy (HCM). I explained to the patient that this is an autosomal dominant condition with incomplete penetrance i.e. not all individuals harboring the HCM mutation will present clinically with HCM, and age-related penetrance where clinical presentation of HCM increases with advanced age. Variability in clinical expression is also seen in families with HCM with affected family members presenting clinically at different ages and with symptoms ranging from mild to severe.  Since HCM is an autosomal dominant condition, first degree-relatives are at a 50% risk of inheriting this condition. They should seek regular surveillance for HCM.  First-degree relatives include his daughter, sons and sisters. At this time his grandchildren, nephews and nieces do not need to undergo screening- they can be screened if they are symptomatic or if their parent is found to have HCM.    Clinical screening of first-degree relatives involves echocardiogram and EKG at regular intervals, frequency is typically determined by age, with children undergoing screening every year until the age of 59 and those over the age of 87 getting screened every 3-5 years until the age of 24. Patient verbalized understanding of this.  Also briefly discussed the inheritance pattern and treatment /management plans for the infiltrative cardiomyopathies that present as HCM phenocopies. About 8-10% of HCM patients can have compound and digenic sarcomeric mutations for HCM  Patient should be aware that genetic testing is a probabilistic test dependent upon age and severity of presentation, presence of risk factors for HCM and importantly family history of HCM or sudden death in first-degree relatives. The potential outcomes of genetic testing and subsequent management of at-risk family members is listed below-  If a mutation is not  identified then it is important that he understands that HCM is a genetic condition and can be passed down to his children. All first-degree relatives should undergo regular screening for HCM.  A negative test result can be due to limitations of the genetic test.   There is also the likelihood of identifying a Variant of unknown significance. This result means that the variant has not been detected in a statistically significant number of HCM patients and/or functional studies have not been performed to verify its pathogenicity. This VUS can be tested in the family to see if it segregates with disease. If a VUS is found, first-degree relatives should undergo regular clinical screening for HCM, but genetic testing for the VUS is otherwise not warranted.  If a pathogenic variant is reported, then first-degree family members can get tested for this variant. If they test positive, it is likely they will develop HCM. In light of variable expression and incomplete penetrance associated with HCM, it is not possible to predict when they will manifest clinically with HCM. It is recommended that family members that test positive for the familial pathogenic variant pursue clinical screening for HCM. Family members that test negative for the familial mutation need not pursue periodic screening for HCM, but seek care if symptoms develop.   Impression  Luke Wheeler was found to have cardiac wall thickness suggestive of HCM at age 44 that is confounded by his past medical history of uncontrolled HTN for several years, a cardiac loading condition that can lead to cardiac hypertrophy. There is no family history of HCM or sudden death.   Genetic testing is recommended to rule out hypertensive heart disease. This test should include the major sarcomeric genes involved in HCM, namely MYBPPC3, MYH7, TNNI3, TNNT2, TPM1, ACTC1, MYL2 and MYL3. It should also include the genes involved in HCM phenocopies as cardiac-predominant forms  of these conditions present clinically as HCM. These include genes for Fabry disease (GLA), Danon disease (LAMP2), WPW syndrome (PRKAG2), Familial transthyretin amyloidosis (TTR) and the genes definitively associated with HCM, namely phospholamban (PLN) and desmin (DES).  In addition, patient should be aware of protections afforded by the Genetic Information Non-Discrimination Act (GINA). GINA protects a patient from losing their employment or health insurance based on their genotype. However, these protections do not cover life insurance and disability. Explained to the patient that family members that are found to have the familial genetic mutation will be denied life insurance even if they are asymptomatic and do not exhibit clinical signs of HCM. Patient verbalized understanding and will discuss this with their family.   Please note that the patient has not been counseled in this visit on other personal, cultural or ethical issues that patient may face due to their heart condition.   Plan After a thorough discussion of the risk and benefits of genetic testing for HCM, Luke Wheeler declines genetic testing due to insurance non-coverage for the HCM test. He will discuss HCM screening guidelines and procuring life insurance with his family.      Danford Pac, Ph.D, [facility] Clinical Molecular Geneticist

## 2024-08-07 ENCOUNTER — Ambulatory Visit: Payer: Self-pay

## 2024-08-22 NOTE — Progress Notes (Signed)
 "  08/31/2024 Luke Wheeler   06-11-1956  [id number]  Primary Physician Long, [place], [name] Primary Cardiologist: Dr. Delford    HPI: 68 y.o. 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 Had atypical SSCP Some exertional some not tightness in chest   Cardiac CTA 05/11/22 reviewed calcium score only 10.5 , 47 th percentile Mild CAD RADS 2 25-49% RCA/IM stenosis  Cardiac MRI 07/11/24 moderate LVH 15 mm concentric ? HTN heart dx  LVEF 51% no significant gad uptake Normal parametric measures   10/27/22 Had right inguinal hernia repair   Seen by [place] 07/25/24 changed olmesartan to entresto  Patient declined genetic testing when seen by Dr Fairy for insurance reasons   He has two older kids in 30/40's and 2 in their tenns/early 20's Discussed getting them screened with echos    Current Meds  Medication Sig   Ascorbic Acid (VITAMIN C) 1000 MG tablet Take 1,000 mg by mouth 3 (three) times a week.   aspirin  EC 81 MG EC tablet Take 1 tablet (81 mg total) by mouth daily.   b complex vitamins capsule Take 1 capsule by mouth daily.   carvedilol  (COREG ) 3.125 MG tablet Take 1 tablet (3.125 mg total) by mouth 2 (two) times daily.   diclofenac Sodium (VOLTAREN) 1 % GEL Apply topically.   ELDERBERRY PO Take 2-3 tablets by mouth See admin instructions. Take 5 times weekly   fluvastatin  XL (LESCOL  XL) 80 MG 24 hr  tablet Take 80 mg by mouth daily.   meloxicam (MOBIC) 15 MG tablet Take 15 mg by mouth daily. (Patient taking differently: Take 15 mg by mouth as needed for pain.)   metFORMIN (GLUCOPHAGE) 500 MG tablet Take 500 mg by mouth daily.   Multiple Vitamin (MULTIVITAMIN WITH MINERALS) TABS tablet Take 1 tablet by mouth daily.   sacubitril -valsartan  (ENTRESTO ) 49-51 MG Take 1 tablet by mouth 2 (two) times daily.   Saw Palmetto, Serenoa repens, (SAW PALMETTO BERRY PO) Take 540 mg by mouth daily.   sildenafil  (VIAGRA ) 100 MG tablet Take 1 tablet (100 mg total) by mouth daily as needed for erectile dysfunction.   verapamil  (CALAN -SR) 180 MG CR tablet TAKE 1 TABLET(180 MG) BY MOUTH AT BEDTIME   No Known Allergies Past Medical History:  Diagnosis Date   Cataract    Chest pain    High cholesterol    Hx of cardiovascular stress test    ETT-Myoview 4/16:  No ischemia, EF 50%, normal wall motion, normal study   Hx of echocardiogram    Echo 11/15:  mod asymmetric septal hypertrophy, no LVOT gradient, EF 55%, no RWMA, Gr 1 DD, septal 16 mm, post wall 11.2 mm, no SAM, trivial MR, normal RVF, mild RAE (HCM variant vs LVH)   Hypertension    LVH (left ventricular hypertrophy)  non-obstructive HCM vs LVH on echo 06/2014   Snoring    Family History  Problem Relation Age of Onset   Diabetes Mother    Hyperlipidemia Father    Stroke Father    Coronary artery disease Father    Colon cancer Neg Hx    Esophageal cancer Neg Hx    Rectal cancer Neg Hx    Stomach cancer Neg Hx    Past Surgical History:  Procedure Laterality Date   CATARACT EXTRACTION, BILATERAL     INGUINAL HERNIA REPAIR Right 10/27/2022   Procedure: OPEN RIGHT INGUINAL HERNIA REPAIR;  Surgeon: Signe Mitzie LABOR, MD;  Location: [facility];  Service: General;  Laterality: Right;   WISDOM TOOTH EXTRACTION     Social History   Socioeconomic History   Marital status: Married    Spouse name: Not on file   Number of children: 4   Years of  education: Not on file   Highest education level: Not on file  Occupational History   Not on file  Tobacco Use   Smoking status: Every Day    Types: Cigarettes   Smokeless tobacco: Never  Vaping Use   Vaping status: Never Used  Substance and Sexual Activity   Alcohol use: Yes    Comment: occas   Drug use: No   Sexual activity: Yes  Other Topics Concern   Not on file  Social History Narrative   Not on file   Social Drivers of Health   Tobacco Use: High Risk (05/05/2024)   Patient History    Smoking Tobacco Use: Every Day    Smokeless Tobacco Use: Never    Passive Exposure: Not on file  Financial Resource Strain: Low Risk (11/26/2023)   Received from [facility]   Overall Financial Resource Strain (CARDIA)    Difficulty of Paying Living Expenses: Not hard at all  Food Insecurity: No Food Insecurity (11/26/2023)   Received from [facility]   Epic    Within the past 12 months, you worried that your food would run out before you got the money to buy more.: Never true    Within the past 12 months, the food you bought just didn't last and you didn't have money to get more.: Never true  Transportation Needs: No Transportation Needs (11/26/2023)   Received from [facility] - Transportation    Lack of Transportation (Non-Medical): No    Lack of Transportation (Medical): No  Physical Activity: Not on file  Stress: Not on file  Social Connections: Unknown (01/05/2022)   Received from [facility]   Social Network    Social Network: Not on file  Intimate Partner Violence: Unknown (11/27/2021)   Received from [facility]   HITS    Physically Hurt: Not on file    Insult or Talk Down To: Not on file    Threaten Physical Harm: Not on file    Scream or Curse: Not on file  Depression (EYV7-0): Not on file  Alcohol Screen: Not on file  Housing: Low Risk (11/26/2023)   Received from [facility]   Epic    At any time in the past 12 months, were you homeless or living in  a shelter (including now)?: No    In the past 12 months, how many times have you moved where you were living?: 0    In the last 12 months, was there a time when you were not able to pay the mortgage or rent on time?:  No  Utilities: Not At Risk (11/26/2023)   Received from [facility] Utilities    Threatened with loss of utilities: No  Health Literacy: Not on file     Review of Systems: General: negative for chills, fever, night sweats or weight changes.  Cardiovascular: negative for chest pain, dyspnea on exertion, edema, orthopnea, palpitations, paroxysmal nocturnal dyspnea or shortness of breath Dermatological: negative for rash Respiratory: negative for cough or wheezing Urologic: negative for hematuria Abdominal: negative for nausea, vomiting, diarrhea, bright red blood per rectum, melena, or hematemesis Neurologic: negative for visual changes, syncope, or dizziness All other systems reviewed and are otherwise negative except as noted above.   Physical Exam:  Blood pressure 130/70, pulse 67, height 5' 11 (1.803 m), weight 195 lb (88.5 kg), SpO2 97%.  Affect appropriate Healthy:  appears stated age HEENT: normal Neck supple with no adenopathy JVP normal no bruits no thyromegaly Lungs clear with no wheezing and good diaphragmatic motion Heart:  S1/S2 no murmur, no rub, gallop or click PMI normal Abdomen: benighn, BS positve, no tenderness, no AAA no bruit.  No HSM or HJR right inguinal hernia repair  Distal pulses intact with no bruits No edema Neuro non-focal Skin warm and dry No muscular weakness   EKG   08/31/2024   SR rate 60 biphasic T wave changes inferior lateral leads chronic 08/31/2024 SR rate 52 chronically abnormal T wave changes laterally   ASSESSMENT AND PLAN:   1. Hypertensive heart disease: abnormal ECG concern for HOCM but MRI with no gadolinium uptake in 07/11/24 with concentric LVH and no high risk features. Declined genetic testing for insurance  reasons.   Septal thickness only 15 mm Increase entresto  to 97/103 mg bid    2. Hypertension:  Well controlled.  Continue current medications and low sodium Dash type diet.    3. Tobacco abuse: Quit smoking October 2021 but has restarted Lung cancer CT 12/17/23 no cancer   4. Hyperlipidemia: on statin therapy. Lipid profile is followed by his PCP. Patient advised to get upcomming fasting labs faxed to our office for review.   5. Erectile dysfunction: should be provided by primary Viagra  dose 100 mg tab usual dose   6. Abnormal ECG: inferolateral biphasic T waves likely related to HTN/LVH  7.  Chest Pain:  non cardiac see above regarding cardiac CTA 05/11/22   Lung cancer CTMay 2026  Increase Entresto  to max dose    F/U cardiology in a year   [name] "

## 2024-08-31 ENCOUNTER — Ambulatory Visit: Attending: Cardiovascular Disease | Admitting: Cardiovascular Disease

## 2024-08-31 VITALS — BP 130/70 | HR 67 | Ht 71.0 in | Wt 195.0 lb

## 2024-08-31 DIAGNOSIS — R9431 Abnormal electrocardiogram [ECG] [EKG]: Secondary | ICD-10-CM | POA: Insufficient documentation

## 2024-08-31 DIAGNOSIS — I421 Obstructive hypertrophic cardiomyopathy: Secondary | ICD-10-CM | POA: Diagnosis present

## 2024-08-31 DIAGNOSIS — Z87891 Personal history of nicotine dependence: Secondary | ICD-10-CM | POA: Insufficient documentation

## 2024-08-31 DIAGNOSIS — Z720 Tobacco use: Secondary | ICD-10-CM | POA: Diagnosis present

## 2024-08-31 DIAGNOSIS — I119 Hypertensive heart disease without heart failure: Secondary | ICD-10-CM | POA: Insufficient documentation

## 2024-08-31 MED ORDER — SACUBITRIL-VALSARTAN 97-103 MG PO TABS: 1.0000 | ORAL_TABLET | Freq: Two times a day (BID) | ORAL | 3 refills | Status: AC

## 2024-08-31 NOTE — Patient Instructions (Signed)
 Medication Instructions:  Increase Sacubitril -Valsartan  (Entresto ) to 97-103 mg, 1 tablet, 2 times daily   Lab Work: None  Testing/Procedures: CT Chest Lung Cancer Screening   Follow-Up: At [facility], you and your health needs are our priority.  As part of our continuing mission to provide you with exceptional heart care, our providers are all part of one team.  This team includes your primary Cardiologist (physician) and Advanced Practice Providers or APPs (Physician Assistants and Nurse Practitioners) who all work together to provide you with the care you need, when you need it.  Your next appointment:   1 year(s)  Provider:   [name], MD

## 2024-09-08 ENCOUNTER — Ambulatory Visit (HOSPITAL_COMMUNITY)
Admission: RE | Admit: 2024-09-08 | Discharge: 2024-09-08 | Disposition: A | Discharge status: Home / Self Care | Source: Ambulatory Visit | Attending: Cardiovascular Disease | Admitting: Cardiovascular Disease

## 2024-09-08 DIAGNOSIS — Z87891 Personal history of nicotine dependence: Secondary | ICD-10-CM | POA: Diagnosis present

## 2024-09-08 DIAGNOSIS — Z720 Tobacco use: Secondary | ICD-10-CM | POA: Diagnosis present

## 2024-09-15 ENCOUNTER — Ambulatory Visit: Payer: Self-pay | Admitting: Cardiovascular Disease
